# Patient Record
Sex: Male | Born: 1955
Health system: Southern US, Community
[De-identification: ages and names within clinical notes are randomized; demographics above are authoritative.]

## PROBLEM LIST (undated history)

## (undated) DIAGNOSIS — I10 Essential (primary) hypertension: Secondary | ICD-10-CM

## (undated) DIAGNOSIS — E785 Hyperlipidemia, unspecified: Secondary | ICD-10-CM

## (undated) DIAGNOSIS — K219 Gastro-esophageal reflux disease without esophagitis: Secondary | ICD-10-CM

## (undated) DIAGNOSIS — E039 Hypothyroidism, unspecified: Secondary | ICD-10-CM

## (undated) DIAGNOSIS — E119 Type 2 diabetes mellitus without complications: Secondary | ICD-10-CM

## (undated) DIAGNOSIS — M109 Gout, unspecified: Secondary | ICD-10-CM

## (undated) HISTORY — DX: Gout, unspecified: M10.9

## (undated) HISTORY — PX: TONSILLECTOMY: SUR1361

## (undated) HISTORY — DX: Type 2 diabetes mellitus without complications: E11.9

## (undated) HISTORY — DX: Essential (primary) hypertension: I10

## (undated) HISTORY — DX: Gastro-esophageal reflux disease without esophagitis: K21.9

## (undated) HISTORY — DX: Hypothyroidism, unspecified: E03.9

## (undated) HISTORY — DX: Hyperlipidemia, unspecified: E78.5

---

## 2014-03-22 DIAGNOSIS — E7800 Pure hypercholesterolemia, unspecified: Secondary | ICD-10-CM | POA: Insufficient documentation

## 2014-03-22 DIAGNOSIS — E119 Type 2 diabetes mellitus without complications: Secondary | ICD-10-CM | POA: Insufficient documentation

## 2014-03-22 DIAGNOSIS — E039 Hypothyroidism, unspecified: Secondary | ICD-10-CM | POA: Insufficient documentation

## 2014-03-22 DIAGNOSIS — N529 Male erectile dysfunction, unspecified: Secondary | ICD-10-CM | POA: Insufficient documentation

## 2014-03-22 DIAGNOSIS — I10 Essential (primary) hypertension: Secondary | ICD-10-CM | POA: Insufficient documentation

## 2014-08-16 DIAGNOSIS — M659 Synovitis and tenosynovitis, unspecified: Secondary | ICD-10-CM | POA: Insufficient documentation

## 2015-07-01 ENCOUNTER — Ambulatory Visit: Payer: Self-pay

## 2015-07-22 ENCOUNTER — Encounter: Payer: Self-pay | Admitting: Urology

## 2015-07-22 ENCOUNTER — Ambulatory Visit (INDEPENDENT_AMBULATORY_CARE_PROVIDER_SITE_OTHER): Payer: 59 | Admitting: Urology

## 2015-07-22 VITALS — BP 150/85 | HR 61 | Ht 70.5 in | Wt 245.7 lb

## 2015-07-22 DIAGNOSIS — N401 Enlarged prostate with lower urinary tract symptoms: Secondary | ICD-10-CM | POA: Diagnosis not present

## 2015-07-22 DIAGNOSIS — R972 Elevated prostate specific antigen [PSA]: Secondary | ICD-10-CM | POA: Diagnosis not present

## 2015-07-22 DIAGNOSIS — N138 Other obstructive and reflux uropathy: Secondary | ICD-10-CM

## 2015-07-22 LAB — BLADDER SCAN AMB NON-IMAGING: SCAN RESULT: 16

## 2015-07-22 NOTE — Progress Notes (Signed)
07/22/2015 4:11 PM   Benjamin Potter January 19, 1956 [id number]  Referring provider: No referring provider defined for this encounter.  Chief Complaint  Patient presents with  . Elevated PSA    referral from Benjamin Potter Benjamin Potter    HPI:  Benjamin Potter is a 59 year old white male who was found to have an elevated PSA at a wellness visit with his PCP, Benjamin Potter.  His PSA returned at 4.18ng/mL on 06/04/2015.    His IPSS score today is 3, which is mild lower urinary tract symptomatology. He is mostly satisfied with his quality life due to his urinary symptoms. His PVR is 16 mL.  His    He denies any dysuria, hematuria or suprapubic pain.     He also denies any recent fevers, chills, nausea or vomiting.  He does not have a family history of PCa.      IPSS      07/22/15 1600       International Prostate Symptom Score   How often have you had the sensation of not emptying your bladder? Not at All     How often have you had to urinate less than every two hours? Not at All     How often have you found you stopped and started again several times when you urinated? Less than 1 in 5 times     How often have you found it difficult to postpone urination? Not at All     How often have you had a weak urinary stream? Not at All     How often have you had to strain to start urination? Not at All     How many times did you typically get up at night to urinate? 2 Times     Total IPSS Score 3     Quality of Life due to urinary symptoms   If you were to spend the rest of your life with your urinary condition just the way it is now how would you feel about that? Mostly Satisfied        Score:  1-7 Mild 8-19 Moderate 20-35 Severe     PMH: Past Medical History  Diagnosis Date  . Diabetes mellitus, type II   . HTN (hypertension)   . HLD (hyperlipidemia)   . Hypothyroidism   . Acid reflux   . Gout     Surgical History: Past Surgical History  Procedure Laterality Date  .  Tonsillectomy      Home Medications:    Medication List       This list is accurate as of: 07/22/15  4:11 PM.  Always use your most recent med list.               amLODipine 10 MG tablet  Commonly known as:  NORVASC  TAKE ONE TABLET BY MOUTH ONCE DAILY     atenolol 50 MG tablet  Commonly known as:  TENORMIN  TAKE ONE TABLET BY MOUTH ONCE DAILY     benazepril 20 MG tablet  Commonly known as:  LOTENSIN  Take by mouth.     colchicine 0.6 MG tablet  Take 2 tablets (1.2mg ) by mouth at first sign of gout flare followed by 1 tablet (0.6mg ) after 1 hour. (Max 1.8mg  within 1 hour)     levothyroxine 125 MCG tablet  Commonly known as:  SYNTHROID, LEVOTHROID  TAKE 1 TABLET BY MOUTH ONCE DAILY ON AN EMPTY STOMACH WITH A GLASS OF WATER AT LEAST 30-60  MINUTES BEFORE BREAKFAST     metFORMIN 1000 MG tablet  Commonly known as:  GLUCOPHAGE  TAKE ONE TABLET BY MOUTH TWICE DAILY WITH MEALS     ONE TOUCH ULTRA TEST test strip  Generic drug:  glucose blood  Use 1 strip via meter twice daily as directed.     simvastatin 20 MG tablet  Commonly known as:  ZOCOR  Take by mouth.        Allergies: No Known Allergies  Family History: Family History  Problem Relation Age of Onset  . Kidney disease Neg Hx   . Bladder Cancer Neg Hx   . Prostate cancer Neg Hx   . Diabetes Mellitus II Mother   . Stroke Father     Social History:  reports that he has quit smoking. He does not have any smokeless tobacco history on file. He reports that he drinks alcohol. He reports that he does not use illicit drugs.  ROS: UROLOGY Frequent Urination?: No Hard to postpone urination?: No Burning/pain with urination?: No Get up at night to urinate?: Yes Leakage of urine?: No Urine stream starts and stops?: Yes Trouble starting stream?: No Do you have to strain to urinate?: No Blood in urine?: No Urinary tract infection?: No Sexually transmitted disease?: No Injury to kidneys or bladder?: No Painful  intercourse?: No Weak stream?: No Erection problems?: Yes Penile pain?: No  Gastrointestinal Nausea?: No Vomiting?: No Indigestion/heartburn?: Yes Diarrhea?: No Constipation?: No  Constitutional Fever: No Night sweats?: No Weight loss?: No Fatigue?: No  Skin Skin rash/lesions?: No Itching?: No  Eyes Blurred vision?: No Double vision?: No  Ears/Nose/Throat Sore throat?: No Sinus problems?: No  Hematologic/Lymphatic Swollen glands?: No Easy bruising?: No  Cardiovascular Leg swelling?: No Chest pain?: No  Respiratory Cough?: No Shortness of breath?: No  Endocrine Excessive thirst?: No  Musculoskeletal Back pain?: No Joint pain?: No  Neurological Headaches?: No Dizziness?: No  Psychologic Depression?: No Anxiety?: No  Physical Exam: BP 150/85 mmHg  Pulse 61  Ht 5' 10.5" (1.791 m)  Wt 245 lb 11.2 oz (111.449 kg)  BMI 34.74 kg/m2  Constitutional:  Alert and oriented, No acute distress. HEENT: [place] AT, moist mucus membranes.  Trachea midline, no masses. Cardiovascular: No clubbing, cyanosis, or edema. Respiratory: Normal respiratory effort, no increased work of breathing. GI: Abdomen is soft, nontender, nondistended, no abdominal masses GU: No CVA tenderness. GU: Patient with circumcised phallus.  Urethral meatus is patent.  No penile discharge. No penile lesions or rashes. Scrotum without lesions, cysts, rashes and/or edema.  Testicles are located scrotally bilaterally. No masses are appreciated in the testicles. Left and right epididymis are normal. Rectal: Patient with  normal sphincter tone. Perineum without scarring or rashes. No rectal masses are appreciated. Prostate is approximately 55 grams, no nodules are appreciated. Seminal vesicles are normal. Skin: No rashes, bruises or suspicious lesions. Lymph: No cervical or inguinal adenopathy. Neurologic: Grossly intact, no focal deficits, moving all 4 extremities. Psychiatric: Normal mood and  affect.  Laboratory Data: Results for orders placed or performed in visit on 07/22/15  PSA  Result Value Ref Range   Prostate Specific Ag, Serum 3.8 0.0 - 4.0 ng/mL  BLADDER SCAN AMB NON-IMAGING  Result Value Ref Range   Scan Result 16    No results found for: WBC, HGB, HCT, MCV, PLT  No results found for: CREATININE  No results found for: PSA  No results found for: TESTOSTERONE  No results found for: HGBA1C  Urinalysis No results found  for: COLORURINE, APPEARANCEUR, LABSPEC, PHURINE, GLUCOSEU, HGBUR, BILIRUBINUR, KETONESUR, PROTEINUR, UROBILINOGEN, NITRITE, LEUKOCYTESUR  Pertinent Imaging:   Assessment & Plan:    1. Elevated PSA:   Patient was found to have a PSA of 4.18 on a routine physical.  His repeated PSA with [place] today is 3.8 ng/mL.  His DRE was benign.  We will continue to monitor closely and have him return in 4 months for a repeat PSA and DRE.    - PSA - BLADDER SCAN AMB NON-IMAGING   2. BPH with LUTS:  Patient's IPSS score is 3/2.  His PVR 16 mL.  His DRE demonstrates enlargement.  He will follow up in 4 months for a PSA, DRE, PVR and an IPSS.        No Follow-up on file.  Michiel Cowboy, PA-C  [facility] [address] [place], [place] [address] [phone]

## 2015-07-23 ENCOUNTER — Telehealth: Payer: Self-pay

## 2015-07-23 DIAGNOSIS — R972 Elevated prostate specific antigen [PSA]: Secondary | ICD-10-CM

## 2015-07-23 LAB — PSA: PROSTATE SPECIFIC AG, SERUM: 3.8 ng/mL (ref 0.0–4.0)

## 2015-07-23 NOTE — Telephone Encounter (Signed)
LMOM

## 2015-07-23 NOTE — Telephone Encounter (Signed)
Pt returned call and PSA results were given. Pt asked if an appt needed to be made in 57mo or just a lab draw. Please advise.

## 2015-07-23 NOTE — Telephone Encounter (Signed)
-----   Message from Shannon A McGowan, PA-C sent at 07/23/2015  8:36 AM EDT ----- PSA has decreased.  I would like to see the patient in 4 months for another PSA to ensure the downward trend continues. 

## 2015-07-23 NOTE — Telephone Encounter (Signed)
-----   Message from Harle Battiest, PA-C sent at 07/23/2015  8:36 AM EDT ----- PSA has decreased.  I would like to see the patient in 4 months for another PSA to ensure the downward trend continues.

## 2015-07-24 DIAGNOSIS — N138 Other obstructive and reflux uropathy: Secondary | ICD-10-CM | POA: Insufficient documentation

## 2015-07-24 DIAGNOSIS — N401 Enlarged prostate with lower urinary tract symptoms: Secondary | ICD-10-CM

## 2015-07-24 DIAGNOSIS — R972 Elevated prostate specific antigen [PSA]: Secondary | ICD-10-CM | POA: Insufficient documentation

## 2015-07-24 NOTE — Telephone Encounter (Signed)
LMOM

## 2015-07-24 NOTE — Telephone Encounter (Signed)
Spoke with pt and made aware of needing an appt in 18mo. Pt voiced understanding and was transferred to the front. Orders have been placed.

## 2015-07-24 NOTE — Telephone Encounter (Signed)
Appointment as well.  He has LUTS that need to be monitored as well as a DRE.

## 2015-11-25 ENCOUNTER — Other Ambulatory Visit: Payer: 59

## 2015-11-25 DIAGNOSIS — R972 Elevated prostate specific antigen [PSA]: Secondary | ICD-10-CM

## 2015-11-26 LAB — PSA: Prostate Specific Ag, Serum: 3.8 ng/mL (ref 0.0–4.0)

## 2015-12-02 ENCOUNTER — Encounter: Payer: Self-pay | Admitting: Urology

## 2015-12-02 ENCOUNTER — Ambulatory Visit (INDEPENDENT_AMBULATORY_CARE_PROVIDER_SITE_OTHER): Payer: 59 | Admitting: Urology

## 2015-12-02 VITALS — BP 162/84 | HR 57 | Ht 70.5 in | Wt 256.8 lb

## 2015-12-02 DIAGNOSIS — R972 Elevated prostate specific antigen [PSA]: Secondary | ICD-10-CM

## 2015-12-02 DIAGNOSIS — N138 Other obstructive and reflux uropathy: Secondary | ICD-10-CM

## 2015-12-02 DIAGNOSIS — N401 Enlarged prostate with lower urinary tract symptoms: Secondary | ICD-10-CM

## 2015-12-02 LAB — BLADDER SCAN AMB NON-IMAGING: SCAN RESULT: 0

## 2015-12-02 NOTE — Progress Notes (Signed)
8:54 AM   Benjamin Potter 07-17-1956 [id number]  Referring provider: Sula Rumple, MD [address] MEDICAL [address] [place], [place] [address]  Chief Complaint  Patient presents with  . Benign Prostatic Hypertrophy    4 month follow up  . Elevated PSA    HPI: Patient is a 59 year old white male who presents today for a 4 month follow up for an elevated PSA and BPH with LUTS.  Patient was found to have an elevated PSA at a wellness visit with his PCP, Dr. Elmer Potter.  His PSA returned at 4.18ng/mL on 06/04/2015.  A repeated PSA performed on 07/2015 was 3.8 ng/mL.  His most recent PSA from last week remains at 3.8 ng/mL.  His IPSS score today is 2, which is mild lower urinary tract symptomatology. He is pleased with his quality life due to his urinary symptoms. His PVR is 0 mL.  He denies any dysuria, hematuria or suprapubic pain. He also denies any recent fevers, chills, nausea or vomiting.  He does not have a family history of PCa.      IPSS      12/02/15 0800       International Prostate Symptom Score   How often have you had the sensation of not emptying your bladder? Not at All     How often have you had to urinate less than every two hours? Less than 1 in 5 times     How often have you found you stopped and started again several times when you urinated? Not at All     How often have you found it difficult to postpone urination? Not at All     How often have you had a weak urinary stream? Not at All     How often have you had to strain to start urination? Not at All     How many times did you typically get up at night to urinate? 1 Time     Total IPSS Score 2     Quality of Life due to urinary symptoms   If you were to spend the rest of your life with your urinary condition just the way it is now how would you feel about that? Pleased        Score:  1-7 Mild 8-19 Moderate 20-35 Severe     PMH: Past Medical History  Diagnosis Date  . Diabetes mellitus, type II (HCC)   . HTN  (hypertension)   . HLD (hyperlipidemia)   . Hypothyroidism   . Acid reflux   . Gout     Surgical History: Past Surgical History  Procedure Laterality Date  . Tonsillectomy      Home Medications:    Medication List       This list is accurate as of: 12/02/15  8:54 AM.  Always use your most recent med list.               amLODipine 10 MG tablet  Commonly known as:  NORVASC  TAKE ONE TABLET BY MOUTH ONCE DAILY     atenolol 50 MG tablet  Commonly known as:  TENORMIN  TAKE ONE TABLET BY MOUTH ONCE DAILY     benazepril 20 MG tablet  Commonly known as:  LOTENSIN  Take by mouth.     colchicine 0.6 MG tablet  Take 2 tablets (1.2mg ) by mouth at first sign of gout flare followed by 1 tablet (0.6mg ) after 1 hour. (Max 1.8mg  within 1 hour)  levothyroxine 125 MCG tablet  Commonly known as:  SYNTHROID, LEVOTHROID  TAKE 1 TABLET BY MOUTH ONCE DAILY ON AN EMPTY STOMACH WITH A GLASS OF WATER AT LEAST 30-60 MINUTES BEFORE BREAKFAST     metFORMIN 1000 MG tablet  Commonly known as:  GLUCOPHAGE  TAKE ONE TABLET BY MOUTH TWICE DAILY WITH MEALS     ONE TOUCH ULTRA TEST test strip  Generic drug:  glucose blood  Use 1 strip via meter twice daily as directed.     predniSONE 20 MG tablet  Commonly known as:  DELTASONE  Take 3 tablets by mouth on day 1, then take 2 tablets by mouth on days 2-4, then take 1 tablet by mouth on days 5-7     simvastatin 20 MG tablet  Commonly known as:  ZOCOR  Take by mouth.        Allergies: No Known Allergies  Family History: Family History  Problem Relation Age of Onset  . Kidney disease Neg Hx   . Bladder Cancer Neg Hx   . Prostate cancer Neg Hx   . Diabetes Mellitus II Mother   . Stroke Father     Social History:  reports that he has quit smoking. He does not have any smokeless tobacco history on file. He reports that he drinks alcohol. He reports that he does not use illicit drugs.  ROS: UROLOGY Frequent Urination?: No Hard to  postpone urination?: No Burning/pain with urination?: No Get up at night to urinate?: Yes Leakage of urine?: No Urine stream starts and stops?: No Trouble starting stream?: No Do you have to strain to urinate?: No Blood in urine?: No Urinary tract infection?: No Sexually transmitted disease?: No Injury to kidneys or bladder?: No Painful intercourse?: No Weak stream?: No Erection problems?: No Penile pain?: No  Gastrointestinal Nausea?: No Vomiting?: No Indigestion/heartburn?: No Diarrhea?: No Constipation?: No  Constitutional Fever: No Night sweats?: No Weight loss?: No Fatigue?: No  Skin Skin rash/lesions?: No Itching?: No  Eyes Blurred vision?: No Double vision?: No  Ears/Nose/Throat Sore throat?: No Sinus problems?: No  Hematologic/Lymphatic Swollen glands?: No Easy bruising?: No  Cardiovascular Leg swelling?: No Chest pain?: No  Respiratory Cough?: No Shortness of breath?: No  Endocrine Excessive thirst?: No  Musculoskeletal Back pain?: No Joint pain?: No  Neurological Headaches?: No Dizziness?: No  Psychologic Depression?: No Anxiety?: No  Physical Exam: BP 162/84 mmHg  Pulse 57  Ht 5' 10.5" (1.791 m)  Wt 256 lb 12.8 oz (116.484 kg)  BMI 36.31 kg/m2  GU: No CVA tenderness. GU: Patient with circumcised phallus.  Urethral meatus is patent.  No penile discharge. No penile lesions or rashes. Scrotum without lesions, cysts, rashes and/or edema.  Testicles are located scrotally bilaterally. No masses are appreciated in the testicles. Left and right epididymis are normal. Rectal: Patient with  normal sphincter tone. Perineum without scarring or rashes. No rectal masses are appreciated. Prostate is approximately 55 grams, no nodules are appreciated. Seminal vesicles are normal.  Laboratory Data:  Lab Results  Component Value Date   PSA 3.8 11/25/2015   PSA 3.8 07/22/2015   Pertinent Imaging: Results for Benjamin Potter (MRN [id number])  as of 12/02/2015 08:52  Ref. Range 12/02/2015 08:40  Scan Result Unknown 0    Assessment & Plan:    1. Elevated PSA:   Patient was found to have a PSA of 4.18 in June 2016 on a routine physical.  His repeated PSA was is 3.8 ng/mL.  It is 3.8  ng/mL again today.  His DRE remains benign.   We will continue to monitor closely and have him return in 6 months for a repeat PSA and exam.     2. BPH with LUTS:  Patient's IPSS score is 2/1.  His PVR 0 mL.  His DRE demonstrates benign enlargement.  His PSA remains stable.  He will follow up in 6 months for a PSA, exam and an IPSS score.    - BLADDER SCAN AMB NON-IMAGING  Return in about 6 months (around 06/01/2016) for IPSS and exam.  Michiel CowboySHANNON Senya Hinzman, [facility]  [facility] [address], [address] [place], [place] [address] [phone]

## 2016-04-02 DIAGNOSIS — J301 Allergic rhinitis due to pollen: Secondary | ICD-10-CM | POA: Insufficient documentation

## 2016-05-25 ENCOUNTER — Other Ambulatory Visit: Payer: 59

## 2016-05-25 DIAGNOSIS — R972 Elevated prostate specific antigen [PSA]: Secondary | ICD-10-CM

## 2016-05-26 LAB — PSA: PROSTATE SPECIFIC AG, SERUM: 4.2 ng/mL — AB (ref 0.0–4.0)

## 2016-06-01 ENCOUNTER — Encounter: Payer: Self-pay | Admitting: Urology

## 2016-06-01 ENCOUNTER — Ambulatory Visit (INDEPENDENT_AMBULATORY_CARE_PROVIDER_SITE_OTHER): Payer: 59 | Admitting: Urology

## 2016-06-01 VITALS — BP 162/102 | HR 60 | Ht 70.5 in | Wt 260.0 lb

## 2016-06-01 DIAGNOSIS — R972 Elevated prostate specific antigen [PSA]: Secondary | ICD-10-CM

## 2016-06-01 DIAGNOSIS — N401 Enlarged prostate with lower urinary tract symptoms: Secondary | ICD-10-CM | POA: Diagnosis not present

## 2016-06-01 DIAGNOSIS — N138 Other obstructive and reflux uropathy: Secondary | ICD-10-CM

## 2016-06-01 NOTE — Progress Notes (Signed)
8:43 AM   Benjamin Potter 1956/09/06 [id number]  Referring provider: Sula Rumple, MD [address] MEDICAL [address] [place], [place] [address]  Chief Complaint  Patient presents with  . Benign Prostatic Hypertrophy    56month    HPI: Patient is a 60 year old Caucasian male who presents today for a 6 month follow up for elevated PSA and BPH with LUTS.  Elevated PSA Patient was found to have an elevated PSA at a wellness visit with his PCP, Dr. Elmer Potter.  His PSA returned at 4.18ng/mL on 06/04/2015.  A repeated PSA performed on 07/2015 was 3.8 ng/mL.  His PSA from 05/25/2016 was 4.2 ng/mL.    BPH with LUTS His IPSS score today is 1, which is mild lower urinary tract symptomatology. He is pleased with his quality life due to his urinary symptoms.  He denies any dysuria, hematuria or suprapubic pain. He also denies any recent fevers, chills, nausea or vomiting.  He does not have a family history of PCa.      IPSS      06/01/16 0800       International Prostate Symptom Score   How often have you had the sensation of not emptying your bladder? Not at All     How often have you had to urinate less than every two hours? Not at All     How often have you found you stopped and started again several times when you urinated? Not at All     How often have you found it difficult to postpone urination? Not at All     How often have you had a weak urinary stream? Not at All     How often have you had to strain to start urination? Not at All     How many times did you typically get up at night to urinate? 1 Time     Total IPSS Score 1     Quality of Life due to urinary symptoms   If you were to spend the rest of your life with your urinary condition just the way it is now how would you feel about that? Pleased        Score:  1-7 Mild 8-19 Moderate 20-35 Severe     PMH: Past Medical History  Diagnosis Date  . Diabetes mellitus, type II (HCC)   . HTN (hypertension)   . HLD (hyperlipidemia)   .  Hypothyroidism   . Acid reflux   . Gout     Surgical History: Past Surgical History  Procedure Laterality Date  . Tonsillectomy      Home Medications:    Medication List       This list is accurate as of: 06/01/16  8:43 AM.  Always use your most recent med list.               amLODipine 10 MG tablet  Commonly known as:  NORVASC  TAKE ONE TABLET BY MOUTH ONCE DAILY     atenolol 50 MG tablet  Commonly known as:  TENORMIN  TAKE ONE TABLET BY MOUTH ONCE DAILY     benazepril 20 MG tablet  Commonly known as:  LOTENSIN  Take by mouth.     colchicine 0.6 MG tablet  Take 2 tablets (1.2mg ) by mouth at first sign of gout flare followed by 1 tablet (0.6mg ) after 1 hour. (Max 1.8mg  within 1 hour)     levothyroxine 125 MCG tablet  Commonly known as:  SYNTHROID, LEVOTHROID  TAKE 1 TABLET BY MOUTH ONCE DAILY ON AN EMPTY STOMACH WITH A GLASS OF WATER AT LEAST 30-60 MINUTES BEFORE BREAKFAST     loratadine 10 MG tablet  Commonly known as:  CLARITIN  Take 10 mg by mouth daily.     metFORMIN 1000 MG tablet  Commonly known as:  GLUCOPHAGE  TAKE ONE TABLET BY MOUTH TWICE DAILY WITH MEALS     ONE TOUCH ULTRA TEST test strip  Generic drug:  glucose blood  Use 1 strip via meter twice daily as directed.     predniSONE 20 MG tablet  Commonly known as:  DELTASONE  Take 3 tablets by mouth on day 1, then take 2 tablets by mouth on days 2-4, then take 1 tablet by mouth on days 5-7     simvastatin 20 MG tablet  Commonly known as:  ZOCOR  Take by mouth.        Allergies: No Known Allergies  Family History: Family History  Problem Relation Age of Onset  . Kidney disease Neg Hx   . Bladder Cancer Neg Hx   . Prostate cancer Neg Hx   . Diabetes Mellitus II Mother   . Stroke Father     Social History:  reports that he has quit smoking. He does not have any smokeless tobacco history on file. He reports that he drinks alcohol. He reports that he does not use illicit  drugs.  ROS: UROLOGY Frequent Urination?: No Hard to postpone urination?: No Burning/pain with urination?: No Get up at night to urinate?: No Leakage of urine?: No Urine stream starts and stops?: No Trouble starting stream?: No Do you have to strain to urinate?: No Blood in urine?: No Urinary tract infection?: No Sexually transmitted disease?: No Injury to kidneys or bladder?: No Painful intercourse?: No Weak stream?: No Erection problems?: No Penile pain?: No  Gastrointestinal Nausea?: No Vomiting?: No Indigestion/heartburn?: No Diarrhea?: No Constipation?: No  Constitutional Fever: No Night sweats?: No Weight loss?: No Fatigue?: No  Skin Skin rash/lesions?: No Itching?: No  Eyes Blurred vision?: No Double vision?: No  Ears/Nose/Throat Sore throat?: No Sinus problems?: No  Hematologic/Lymphatic Swollen glands?: No Easy bruising?: No  Cardiovascular Leg swelling?: No Chest pain?: No  Respiratory Cough?: No Shortness of breath?: No  Endocrine Excessive thirst?: No  Musculoskeletal Back pain?: No Joint pain?: No  Neurological Headaches?: No Dizziness?: No  Psychologic Depression?: No Anxiety?: No  Physical Exam: BP 162/102 mmHg  Pulse 60  Ht 5' 10.5" (1.791 m)  Wt 260 lb (117.935 kg)  BMI 36.77 kg/m2  GU: No CVA tenderness. GU: Patient with circumcised phallus.  Urethral meatus is patent.  No penile discharge. No penile lesions or rashes. Scrotum without lesions, cysts, rashes and/or edema.  Testicles are located scrotally bilaterally. No masses are appreciated in the testicles. Left and right epididymis are normal. Rectal: Patient with  normal sphincter tone. Perineum without scarring or rashes. No rectal masses are appreciated. Prostate is approximately 55 grams, no nodules are appreciated. Seminal vesicles are normal.  Laboratory Data: PSA History  4.18 ng/mL on 05/2015  3.8 ng/mL on 05/2015  4.2 ng/mL on 05/25/2016  Pertinent  Imaging: Results for Benjamin Potter, Benjamin Potter (MRN [id number]) as of 12/02/2015 08:52  Ref. Range 12/02/2015 08:40  Scan Result Unknown 0    Assessment & Plan:    1. Elevated PSA:   Patient was found to have a PSA of 4.18 in June 2016 on a routine physical.  His repeated PSA was is  3.8 ng/mL.  It is 4.2 ng/mL today.  His DRE remains benign.   We discussed repeating the PSA today, returning 4 months for repeat examination and PSA or initiating finasteride 5 mg daily and returning in 3 months for a PSA.  He would like us to repeat the PSA.  If the PSA returns lower, we will continue 6 month follow-ups. If it returns at the same value or somewhat elevated, we'll initiate finasteride 5 mg daily and he will return in 3 months for repeat PSA.   2. BPH with LUTS:  Patient's IPSS score is 1/1.  His PVR 0 mL.   He will follow up pending PSA results.      Return for pending PSA results.  Michiel CowboySHANNON Lodema Parma, PA-C  [facility] [address], [address] [place], [place] [address] [phone]

## 2016-06-02 ENCOUNTER — Telehealth: Payer: Self-pay

## 2016-06-02 DIAGNOSIS — R972 Elevated prostate specific antigen [PSA]: Secondary | ICD-10-CM

## 2016-06-02 LAB — PSA: Prostate Specific Ag, Serum: 4.9 ng/mL — ABNORMAL HIGH (ref 0.0–4.0)

## 2016-06-02 NOTE — Telephone Encounter (Signed)
LMOM

## 2016-06-02 NOTE — Telephone Encounter (Signed)
-----   Message from Harle BattiestShannon A McGowan, PA-C sent at 06/02/2016  8:08 AM EDT ----- PSA is still increasing.  I suggest starting finasteride 5 mg daily and repeated the PSA in three months.

## 2016-06-03 MED ORDER — FINASTERIDE 5 MG PO TABS: 5.0000 mg | ORAL_TABLET | Freq: Every day | ORAL | Status: DC

## 2016-06-03 NOTE — Telephone Encounter (Signed)
Spoke with pt in reference to PSA and finasteride. Pt voiced understanding. Orders placed and lab appt made.

## 2016-06-03 NOTE — Telephone Encounter (Signed)
LMOM

## 2016-06-09 ENCOUNTER — Telehealth: Payer: Self-pay | Admitting: Urology

## 2016-06-09 DIAGNOSIS — R972 Elevated prostate specific antigen [PSA]: Secondary | ICD-10-CM

## 2016-06-09 MED ORDER — FINASTERIDE 5 MG PO TABS: 5.0000 mg | ORAL_TABLET | Freq: Every day | ORAL | Status: DC

## 2016-06-09 NOTE — Telephone Encounter (Signed)
I've resent the prescription to the pharmacy.  Please apologize to the patient, but we always seem to have trouble with escribing to [facility].

## 2016-06-09 NOTE — Telephone Encounter (Signed)
Patient is still waiting on his medication. He said it was never called in to the pharmacy for finasteride. Can we check on this and call him.  Thanks,  michelle

## 2016-06-11 NOTE — Telephone Encounter (Signed)
Called and LM that this had been done   WinthropMichelle

## 2016-09-03 ENCOUNTER — Other Ambulatory Visit: Payer: 59

## 2016-09-03 DIAGNOSIS — R972 Elevated prostate specific antigen [PSA]: Secondary | ICD-10-CM

## 2016-09-04 ENCOUNTER — Telehealth: Payer: Self-pay | Admitting: Family Medicine

## 2016-09-04 LAB — PSA: Prostate Specific Ag, Serum: 2 ng/mL (ref 0.0–4.0)

## 2016-09-04 NOTE — Telephone Encounter (Signed)
-----   Message from Harle BattiestShannon A McGowan, PA-C sent at 09/04/2016  8:20 AM EDT ----- PSA has declined and I would like to see him again in 3 months.  PSA prior to the appointment.

## 2016-09-04 NOTE — Telephone Encounter (Signed)
Left message on machine for patient to return call

## 2016-09-08 NOTE — Telephone Encounter (Signed)
LMOM

## 2016-09-10 NOTE — Telephone Encounter (Signed)
LMOM- will send a letter.  

## 2016-12-01 ENCOUNTER — Other Ambulatory Visit: Payer: Self-pay

## 2016-12-01 DIAGNOSIS — R972 Elevated prostate specific antigen [PSA]: Secondary | ICD-10-CM

## 2016-12-03 ENCOUNTER — Other Ambulatory Visit: Payer: Self-pay | Admitting: Urology

## 2016-12-03 ENCOUNTER — Other Ambulatory Visit: Payer: 59

## 2016-12-03 DIAGNOSIS — R972 Elevated prostate specific antigen [PSA]: Secondary | ICD-10-CM

## 2016-12-04 LAB — PSA: PROSTATE SPECIFIC AG, SERUM: 1.6 ng/mL (ref 0.0–4.0)

## 2016-12-09 ENCOUNTER — Encounter: Payer: Self-pay | Admitting: Urology

## 2016-12-09 ENCOUNTER — Ambulatory Visit (INDEPENDENT_AMBULATORY_CARE_PROVIDER_SITE_OTHER): Payer: 59 | Admitting: Urology

## 2016-12-09 VITALS — BP 158/83 | HR 58 | Ht 70.5 in | Wt 257.8 lb

## 2016-12-09 DIAGNOSIS — N138 Other obstructive and reflux uropathy: Secondary | ICD-10-CM

## 2016-12-09 DIAGNOSIS — Z87898 Personal history of other specified conditions: Secondary | ICD-10-CM

## 2016-12-09 DIAGNOSIS — N401 Enlarged prostate with lower urinary tract symptoms: Secondary | ICD-10-CM | POA: Diagnosis not present

## 2016-12-09 MED ORDER — FINASTERIDE 5 MG PO TABS: 5.0000 mg | ORAL_TABLET | Freq: Every day | ORAL | 3 refills | Status: DC

## 2016-12-09 NOTE — Progress Notes (Signed)
8:28 AM   Mardene SayerEddie Verstraete [birth year] [id number]  Referring provider: Sula Rumpleharanjit Virk, MD [address] [place], [place] [address]  Chief Complaint  Patient presents with  . Benign Prostatic Hypertrophy    3 month follow up  . Elevated PSA    HPI: Patient is a 60 year old Caucasian male who presents today for a 6 month follow up for elevated PSA and BPH with LUTS.  History of elevated PSA Patient was found to have an elevated PSA at a wellness visit with his PCP, Dr. Elmer RampVirk.  His PSA returned at 4.18ng/mL on 06/04/2015.  A repeated PSA performed on 07/2015 was 3.8 ng/mL.  His PSA is now 1.6 ng/mL on 12/03/2016.    BPH with LUTS His IPSS score today is 0, which is no lower urinary tract symptomatology. He is delighted with his quality life due to his urinary symptoms.  His previous I PSS score was 1/1.  He has no complaints at this visit.  He is currently on finasteride 5 mg daily.  He denies any dysuria, hematuria or suprapubic pain. He also denies any recent fevers, chills, nausea or vomiting.  He does not have a family history of PCa.      IPSS    Row Name 12/09/16 0800         International Prostate Symptom Score   How often have you had the sensation of not emptying your bladder? Not at All     How often have you had to urinate less than every two hours? Not at All     How often have you found you stopped and started again several times when you urinated? Not at All     How often have you found it difficult to postpone urination? Not at All     How often have you had a weak urinary stream? Not at All     How often have you had to strain to start urination? Not at All     How many times did you typically get up at night to urinate? None     Total IPSS Score 0       Quality of Life due to urinary symptoms   If you were to spend the rest of your life with your urinary condition just the way it is now how would you feel about that? Delighted        Score:  1-7  Mild 8-19 Moderate 20-35 Severe     PMH: Past Medical History:  Diagnosis Date  . Acid reflux   . Diabetes mellitus, type II (HCC)   . Gout   . HLD (hyperlipidemia)   . HTN (hypertension)   . Hypothyroidism     Surgical History: Past Surgical History:  Procedure Laterality Date  . TONSILLECTOMY      Home Medications:  Allergies as of 12/09/2016   No Known Allergies     Medication List       Accurate as of 12/09/16  8:28 AM. Always use your most recent med list.          amLODipine 10 MG tablet Commonly known as:  NORVASC TAKE ONE TABLET BY MOUTH ONCE DAILY   atenolol 50 MG tablet Commonly known as:  TENORMIN TAKE ONE TABLET BY MOUTH ONCE DAILY   benazepril 20 MG tablet Commonly known as:  LOTENSIN Take by mouth.   colchicine 0.6 MG tablet Take 2 tablets (1.2mg ) by mouth at first sign of gout flare followed  by 1 tablet (0.6mg ) after 1 hour. (Max 1.8mg  within 1 hour)   finasteride 5 MG tablet Commonly known as:  PROSCAR Take 1 tablet (5 mg total) by mouth daily.   fluticasone 50 MCG/ACT nasal spray Commonly known as:  FLONASE Place into the nose.   levothyroxine 125 MCG tablet Commonly known as:  SYNTHROID, LEVOTHROID TAKE 1 TABLET BY MOUTH ONCE DAILY ON AN EMPTY STOMACH WITH A GLASS OF WATER AT LEAST 30-60 MINUTES BEFORE BREAKFAST   loratadine 10 MG tablet Commonly known as:  CLARITIN Take 10 mg by mouth daily.   metFORMIN 1000 MG tablet Commonly known as:  GLUCOPHAGE TAKE ONE TABLET BY MOUTH TWICE DAILY WITH MEALS   ONE TOUCH ULTRA TEST test strip Generic drug:  glucose blood Use 1 strip via meter twice daily as directed.   predniSONE 20 MG tablet Commonly known as:  DELTASONE Take 3 tablets by mouth on day 1, then take 2 tablets by mouth on days 2-4, then take 1 tablet by mouth on days 5-7   sildenafil 100 MG tablet Commonly known as:  VIAGRA Take by mouth.   simvastatin 20 MG tablet Commonly known as:  ZOCOR Take by mouth.        Allergies: No Known Allergies  Family History: Family History  Problem Relation Age of Onset  . Diabetes Mellitus II Mother   . Stroke Father   . Kidney disease Neg Hx   . Bladder Cancer Neg Hx   . Prostate cancer Neg Hx     Social History:  reports that he has quit smoking. He has never used smokeless tobacco. He reports that he drinks alcohol. He reports that he does not use drugs.  ROS: UROLOGY Frequent Urination?: No Hard to postpone urination?: No Burning/pain with urination?: No Get up at night to urinate?: No Leakage of urine?: No Urine stream starts and stops?: No Trouble starting stream?: No Do you have to strain to urinate?: No Blood in urine?: No Urinary tract infection?: No Sexually transmitted disease?: No Injury to kidneys or bladder?: No Painful intercourse?: No Weak stream?: No Erection problems?: No Penile pain?: No  Gastrointestinal Nausea?: No Vomiting?: No Indigestion/heartburn?: No Diarrhea?: No Constipation?: No  Constitutional Fever: No Night sweats?: No Weight loss?: No Fatigue?: No  Skin Skin rash/lesions?: No Itching?: No  Eyes Blurred vision?: No Double vision?: No  Ears/Nose/Throat Sore throat?: No Sinus problems?: No  Hematologic/Lymphatic Swollen glands?: No Easy bruising?: No  Cardiovascular Leg swelling?: No Chest pain?: No  Respiratory Cough?: No Shortness of breath?: No  Endocrine Excessive thirst?: No  Musculoskeletal Back pain?: No Joint pain?: No  Neurological Headaches?: No Dizziness?: No  Psychologic Depression?: No Anxiety?: No  Physical Exam: BP (!) 158/83   Pulse (!) 58   Ht 5' 10.5" (1.791 m)   Wt 257 lb 12.8 oz (116.9 kg)   BMI 36.47 kg/m   GU: No CVA tenderness. GU: Patient with circumcised phallus.  Urethral meatus is patent.  No penile discharge. No penile lesions or rashes. Scrotum without lesions, cysts, rashes and/or edema.  Testicles are located scrotally bilaterally.  No masses are appreciated in the testicles. Left and right epididymis are normal. Rectal: Patient with  normal sphincter tone. Perineum without scarring or rashes. No rectal masses are appreciated. Prostate is approximately 55 grams, no nodules are appreciated. Seminal vesicles are normal.  Laboratory Data: PSA History  4.18 ng/mL on 05/2015  3.8 ng/mL on 05/2015  4.2 ng/mL on 05/25/2016 - started on  finasteride  1.6 ng/mL on 12/03/2016   Assessment & Plan:    1. History of elevated PSA  - PSA reached 4.18 in 05/2015  - continue finasteride 5 mg daily  - RTC in 6 months for PSA and exam  2. BPH with LUTS  - IPSS score is 0/0, it is improving  - Continue conservative management, avoiding bladder irritants and timed voiding's  - Continue finasteride 5 mg daily; refills given  - Cannot tolerate medication or medication failure, schedule cystoscopy  - RTC in 6 months for IPSS, PSA and exam    Return in about 6 months (around 06/09/2017) for IPSS, PSA and exam.  Michiel CowboySHANNON Layna Roeper, [facility]  [facility] [address], [address] [place], [place] [phone]

## 2017-01-19 DIAGNOSIS — I10 Essential (primary) hypertension: Secondary | ICD-10-CM | POA: Diagnosis not present

## 2017-01-19 DIAGNOSIS — E119 Type 2 diabetes mellitus without complications: Secondary | ICD-10-CM | POA: Diagnosis not present

## 2017-01-19 DIAGNOSIS — E7800 Pure hypercholesterolemia, unspecified: Secondary | ICD-10-CM | POA: Diagnosis not present

## 2017-05-20 ENCOUNTER — Other Ambulatory Visit: Payer: Self-pay

## 2017-05-20 DIAGNOSIS — Z87898 Personal history of other specified conditions: Secondary | ICD-10-CM

## 2017-06-07 ENCOUNTER — Other Ambulatory Visit: Payer: 59

## 2017-06-07 DIAGNOSIS — Z87898 Personal history of other specified conditions: Secondary | ICD-10-CM | POA: Diagnosis not present

## 2017-06-08 LAB — PSA: PROSTATE SPECIFIC AG, SERUM: 1.5 ng/mL (ref 0.0–4.0)

## 2017-06-08 NOTE — Progress Notes (Signed)
8:19 AM   Benjamin Potter 05-22-1956 [id number]  Referring provider: Sula Rumple, MD No address on file  Chief Complaint  Patient presents with  . Benign Prostatic Hypertrophy    6 month follow up  . Elevated PSA    HPI: Patient is a 61 year old Caucasian male who presents today for a 6 month follow up for elevated PSA and BPH with LUTS.  History of elevated PSA Patient was found to have an elevated PSA at a wellness visit with his PCP, Dr. Elmer Ramp.  His PSA returned at 4.18ng/mL on 06/04/2015.  A repeated PSA performed on 07/2015 was 3.8 ng/mL.  His PSA is now 1.5 ng/mL on 06/07/2017.  BPH with LUTS His IPSS score today is 10, which is moderate lower urinary tract symptomatology. He is pleased with his quality life due to his urinary symptoms.  His previous I PSS score was 0/0.   He has no complaints at this visit.  He is currently on finasteride 5 mg daily.  He denies any dysuria, hematuria or suprapubic pain. He also denies any recent fevers, chills, nausea or vomiting.  He does not have a family history of PCa.      IPSS    Row Name 06/09/17 0800         International Prostate Symptom Score   How often have you had the sensation of not emptying your bladder? Not at All     How often have you had to urinate less than every two hours? Less than 1 in 5 times     How often have you found you stopped and started again several times when you urinated? More than half the time     How often have you found it difficult to postpone urination? Not at All     How often have you had a weak urinary stream? Not at All     How often have you had to strain to start urination? Not at All     How many times did you typically get up at night to urinate? 5 Times     Total IPSS Score 10       Quality of Life due to urinary symptoms   If you were to spend the rest of your life with your urinary condition just the way it is now how would you feel about that? Pleased        Score:  1-7  Mild 8-19 Moderate 20-35 Severe  Patient states he is suffering with erectile dysfunction and had been receiving Viagra through his primary care physician. Unfortunately this become cost prohibitive. He did ask me about an over-the-counter supplement that he had purchased from [employer] that has claims he can boost year nitrogen oxide naturally.     PMH: Past Medical History:  Diagnosis Date  . Acid reflux   . Diabetes mellitus, type II (HCC)   . Gout   . HLD (hyperlipidemia)   . HTN (hypertension)   . Hypothyroidism     Surgical History: Past Surgical History:  Procedure Laterality Date  . TONSILLECTOMY      Home Medications:  Allergies as of 06/09/2017   No Known Allergies     Medication List       Accurate as of 06/09/17  8:19 AM. Always use your most recent med list.          amLODipine 10 MG tablet Commonly known as:  NORVASC TAKE ONE TABLET BY MOUTH ONCE DAILY  atenolol 50 MG tablet Commonly known as:  TENORMIN TAKE ONE TABLET BY MOUTH ONCE DAILY   benazepril 20 MG tablet Commonly known as:  LOTENSIN Take by mouth.   colchicine 0.6 MG tablet Take 2 tablets (1.2mg ) by mouth at first sign of gout flare followed by 1 tablet (0.6mg ) after 1 hour. (Max 1.8mg  within 1 hour)   diclofenac 75 MG EC tablet Commonly known as:  VOLTAREN diclofenac sodium 75 mg tablet,delayed release  Take 1 tablet twice a day by oral route after meals for 10 days.   finasteride 5 MG tablet Commonly known as:  PROSCAR Take 1 tablet (5 mg total) by mouth daily.   fluticasone 50 MCG/ACT nasal spray Commonly known as:  FLONASE Place into the nose.   levothyroxine 125 MCG tablet Commonly known as:  SYNTHROID, LEVOTHROID TAKE 1 TABLET BY MOUTH ONCE DAILY ON AN EMPTY STOMACH WITH A GLASS OF WATER AT LEAST 30-60 MINUTES BEFORE BREAKFAST   loratadine 10 MG tablet Commonly known as:  CLARITIN Take 10 mg by mouth daily.   metFORMIN 1000 MG tablet Commonly known as:   GLUCOPHAGE TAKE ONE TABLET BY MOUTH TWICE DAILY WITH MEALS   ONE TOUCH ULTRA TEST test strip Generic drug:  glucose blood Use 1 strip via meter twice daily as directed.   predniSONE 20 MG tablet Commonly known as:  DELTASONE Take 3 tablets by mouth on day 1, then take 2 tablets by mouth on days 2-4, then take 1 tablet by mouth on days 5-7   sildenafil 100 MG tablet Commonly known as:  VIAGRA Take by mouth.   simvastatin 20 MG tablet Commonly known as:  ZOCOR Take by mouth.   SOMA 350 MG tablet Generic drug:  carisoprodol Soma 350 mg tablet  Take 1 tablet 3 times a day by oral route as needed.       Allergies: No Known Allergies  Family History: Family History  Problem Relation Age of Onset  . Diabetes Mellitus II Mother   . Stroke Father   . Kidney disease Neg Hx   . Bladder Cancer Neg Hx   . Prostate cancer Neg Hx   . Kidney cancer Neg Hx     Social History:  reports that he has quit smoking. He has never used smokeless tobacco. He reports that he drinks alcohol. He reports that he does not use drugs.  ROS: UROLOGY Frequent Urination?: No Hard to postpone urination?: No Burning/pain with urination?: No Get up at night to urinate?: Yes Leakage of urine?: No Urine stream starts and stops?: Yes Trouble starting stream?: No Do you have to strain to urinate?: No Blood in urine?: No Urinary tract infection?: No Sexually transmitted disease?: No Injury to kidneys or bladder?: No Painful intercourse?: No Weak stream?: No Erection problems?: Yes Penile pain?: No  Gastrointestinal Nausea?: No Vomiting?: No Indigestion/heartburn?: No Diarrhea?: No Constipation?: No  Constitutional Fever: No Night sweats?: No Weight loss?: No Fatigue?: No  Skin Skin rash/lesions?: No Itching?: No  Eyes Blurred vision?: No Double vision?: No  Ears/Nose/Throat Sore throat?: No Sinus problems?: No  Hematologic/Lymphatic Swollen glands?: No Easy bruising?:  No  Cardiovascular Leg swelling?: No Chest pain?: No  Respiratory Cough?: No Shortness of breath?: No  Endocrine Excessive thirst?: No  Musculoskeletal Back pain?: No Joint pain?: No  Neurological Headaches?: No Dizziness?: No  Psychologic Depression?: No Anxiety?: No  Physical Exam: BP (!) 162/96   Pulse (!) 54   Ht 5' 10.5" (1.791 m)  Wt 260 lb 4.8 oz (118.1 kg)   BMI 36.82 kg/m   GU: No CVA tenderness. GU: Patient with circumcised phallus.  Urethral meatus is patent.  No penile discharge. No penile lesions or rashes. Scrotum without lesions, cysts, rashes and/or edema.  Testicles are located scrotally bilaterally. No masses are appreciated in the testicles. Left and right epididymis are normal. Rectal: Patient with  normal sphincter tone. Perineum without scarring or rashes. No rectal masses are appreciated. Prostate is approximately 55 grams, no nodules are appreciated. Seminal vesicles are normal.  Laboratory Data: PSA History  4.18 ng/mL on 05/2015  3.8 ng/mL on 05/2015  4.2 ng/mL on 05/25/2016 - started on finasteride  1.6 ng/mL on 12/03/2016  1.5 ng/mL on 06/07/2017   Assessment & Plan:    1. History of elevated PSA  - PSA reached 4.18 in 05/2015 - now 1.5  - continue finasteride 5 mg daily  - RTC in 6 months for PSA and exam  2. BPH with LUTS  - IPSS score is 10/0, it is worse  - Continue conservative management, avoiding bladder irritants and timed voiding's  - Continue finasteride 5 mg daily; refills given  - RTC in 6 months for IPSS, PSA and exam   3. Erectile dysfunction  - Explained to the patient that I could not comment on the supplement from [employer] as I have not seen any clinical data for studies on the supplement and could not advise him to take it  - Offered the patient to prescription of sildenafil 20 mg and he stated he would like one  - Sildenafil 20 mg, 3 to 5 tablets two hours prior to intercourse on an empty stomach, # 50; he is  warned not to take medications that contain nitrates.  I also advised him of the side effects, such as: headache, flushing, dyspepsia, abnormal vision, nasal congestion, back pain, myalgia, nausea, dizziness, and rash.   Return in about 1 year (around 06/09/2018) for IPSS, PSA and exam.  Michiel CowboySHANNON Stephaniemarie Stoffel, [facility]  [facility] [address], [address] [place], [place] [address] [phone]

## 2017-06-09 ENCOUNTER — Ambulatory Visit (INDEPENDENT_AMBULATORY_CARE_PROVIDER_SITE_OTHER): Payer: 59 | Admitting: Urology

## 2017-06-09 ENCOUNTER — Ambulatory Visit: Payer: 59 | Admitting: Urology

## 2017-06-09 ENCOUNTER — Encounter: Payer: Self-pay | Admitting: Urology

## 2017-06-09 VITALS — BP 162/96 | HR 54 | Ht 70.5 in | Wt 260.3 lb

## 2017-06-09 DIAGNOSIS — Z87898 Personal history of other specified conditions: Secondary | ICD-10-CM | POA: Diagnosis not present

## 2017-06-09 DIAGNOSIS — N401 Enlarged prostate with lower urinary tract symptoms: Secondary | ICD-10-CM | POA: Diagnosis not present

## 2017-06-09 DIAGNOSIS — N138 Other obstructive and reflux uropathy: Secondary | ICD-10-CM | POA: Diagnosis not present

## 2017-06-09 DIAGNOSIS — N529 Male erectile dysfunction, unspecified: Secondary | ICD-10-CM | POA: Diagnosis not present

## 2017-06-09 MED ORDER — SILDENAFIL CITRATE 20 MG PO TABS: ORAL_TABLET | ORAL | 3 refills | Status: DC

## 2017-08-25 DIAGNOSIS — J0181 Other acute recurrent sinusitis: Secondary | ICD-10-CM | POA: Diagnosis not present

## 2017-08-25 DIAGNOSIS — B370 Candidal stomatitis: Secondary | ICD-10-CM | POA: Diagnosis not present

## 2017-08-27 DIAGNOSIS — E785 Hyperlipidemia, unspecified: Secondary | ICD-10-CM | POA: Diagnosis not present

## 2017-08-27 DIAGNOSIS — I10 Essential (primary) hypertension: Secondary | ICD-10-CM | POA: Diagnosis not present

## 2017-08-27 DIAGNOSIS — E119 Type 2 diabetes mellitus without complications: Secondary | ICD-10-CM | POA: Diagnosis not present

## 2017-09-06 DIAGNOSIS — E785 Hyperlipidemia, unspecified: Secondary | ICD-10-CM | POA: Diagnosis not present

## 2017-09-06 DIAGNOSIS — I10 Essential (primary) hypertension: Secondary | ICD-10-CM | POA: Diagnosis not present

## 2017-09-06 DIAGNOSIS — Z1159 Encounter for screening for other viral diseases: Secondary | ICD-10-CM | POA: Diagnosis not present

## 2017-09-06 DIAGNOSIS — E119 Type 2 diabetes mellitus without complications: Secondary | ICD-10-CM | POA: Diagnosis not present

## 2017-12-06 ENCOUNTER — Other Ambulatory Visit: Payer: Self-pay | Admitting: Family Medicine

## 2017-12-06 DIAGNOSIS — R945 Abnormal results of liver function studies: Secondary | ICD-10-CM | POA: Diagnosis not present

## 2017-12-06 DIAGNOSIS — Z789 Other specified health status: Secondary | ICD-10-CM | POA: Diagnosis not present

## 2017-12-06 DIAGNOSIS — E1165 Type 2 diabetes mellitus with hyperglycemia: Secondary | ICD-10-CM | POA: Diagnosis not present

## 2017-12-06 DIAGNOSIS — F1090 Alcohol use, unspecified, uncomplicated: Secondary | ICD-10-CM

## 2017-12-06 DIAGNOSIS — R7989 Other specified abnormal findings of blood chemistry: Secondary | ICD-10-CM

## 2017-12-16 ENCOUNTER — Ambulatory Visit
Admission: RE | Admit: 2017-12-16 | Discharge: 2017-12-16 | Disposition: A | Discharge status: Home / Self Care | Payer: 59 | Source: Ambulatory Visit | Attending: Family Medicine | Admitting: Family Medicine

## 2017-12-16 DIAGNOSIS — Z789 Other specified health status: Secondary | ICD-10-CM | POA: Diagnosis not present

## 2017-12-16 DIAGNOSIS — K760 Fatty (change of) liver, not elsewhere classified: Secondary | ICD-10-CM | POA: Diagnosis not present

## 2017-12-16 DIAGNOSIS — R945 Abnormal results of liver function studies: Secondary | ICD-10-CM | POA: Diagnosis not present

## 2017-12-16 DIAGNOSIS — F1090 Alcohol use, unspecified, uncomplicated: Secondary | ICD-10-CM

## 2017-12-16 DIAGNOSIS — R7989 Other specified abnormal findings of blood chemistry: Secondary | ICD-10-CM

## 2017-12-29 ENCOUNTER — Other Ambulatory Visit: Payer: Self-pay | Admitting: Urology

## 2017-12-29 DIAGNOSIS — M109 Gout, unspecified: Secondary | ICD-10-CM | POA: Diagnosis not present

## 2017-12-29 DIAGNOSIS — Z87898 Personal history of other specified conditions: Secondary | ICD-10-CM

## 2018-01-05 DIAGNOSIS — E1165 Type 2 diabetes mellitus with hyperglycemia: Secondary | ICD-10-CM | POA: Diagnosis not present

## 2018-01-15 ENCOUNTER — Other Ambulatory Visit: Payer: Self-pay | Admitting: Urology

## 2018-02-03 ENCOUNTER — Telehealth: Payer: Self-pay | Admitting: Radiology

## 2018-02-03 ENCOUNTER — Other Ambulatory Visit: Payer: Self-pay | Admitting: Urology

## 2018-02-03 MED ORDER — SILDENAFIL CITRATE 100 MG PO TABS: 100.0000 mg | ORAL_TABLET | Freq: Every day | ORAL | 12 refills | Status: DC | PRN

## 2018-02-03 NOTE — Telephone Encounter (Signed)
Notified pt of script sent to pharmacy. Pt voices understanding & has no further questions at this time.

## 2018-02-03 NOTE — Telephone Encounter (Signed)
Pt wants to change sildenafil Rx from 20mg  to 100mg . [facility] [place].

## 2018-02-03 NOTE — Progress Notes (Signed)
Script for Viagra given.

## 2018-03-21 DIAGNOSIS — R42 Dizziness and giddiness: Secondary | ICD-10-CM | POA: Diagnosis not present

## 2018-03-21 DIAGNOSIS — Z789 Other specified health status: Secondary | ICD-10-CM | POA: Diagnosis not present

## 2018-03-21 DIAGNOSIS — E118 Type 2 diabetes mellitus with unspecified complications: Secondary | ICD-10-CM | POA: Diagnosis not present

## 2018-04-09 DIAGNOSIS — R42 Dizziness and giddiness: Secondary | ICD-10-CM | POA: Diagnosis not present

## 2018-05-27 DIAGNOSIS — M109 Gout, unspecified: Secondary | ICD-10-CM | POA: Diagnosis not present

## 2018-05-27 DIAGNOSIS — E785 Hyperlipidemia, unspecified: Secondary | ICD-10-CM | POA: Diagnosis not present

## 2018-05-27 DIAGNOSIS — E118 Type 2 diabetes mellitus with unspecified complications: Secondary | ICD-10-CM | POA: Diagnosis not present

## 2018-05-27 DIAGNOSIS — I10 Essential (primary) hypertension: Secondary | ICD-10-CM | POA: Diagnosis not present

## 2018-06-05 NOTE — Progress Notes (Signed)
8:50 AM   Benjamin Potter June 06, 1956 [id number]  Referring provider: Sula RumpleVirk, Charanjit, MD No address on file  Chief Complaint  Patient presents with  . Benign Prostatic Hypertrophy    HPI: Patient is a 62 year old Caucasian male who presents today for a 6 month follow up for elevated PSA and BPH with LUTS.  History of elevated PSA PSA Trend  4.18 in June 2016  3.8 in August 2016  3.8 in December 2016  4.2 in March 2017  4.9 in June 2017  2.0 (4.0) in September 2017 - on finasteride  1.6 (3.2) in December 2017  1.5 (3.0) in June 2018  1.4 (2.8) in June 2019   BPH with LUTS His IPSS score today is 4, which is mild lower urinary tract symptomatology.  He is pleased with his quality life due to his urinary symptoms.  His previous I PSS score was 10/1.   He has no complaints at this visit.  He is currently on finasteride 5 mg daily.  He denies any dysuria, hematuria or suprapubic pain. He also denies any recent fevers, chills, nausea or vomiting.  He does not have a family history of PCa.  IPSS    Row Name 06/08/18 0800         International Prostate Symptom Score   How often have you had the sensation of not emptying your bladder?  Not at All     How often have you had to urinate less than every two hours?  Less than 1 in 5 times     How often have you found you stopped and started again several times when you urinated?  Less than 1 in 5 times     How often have you found it difficult to postpone urination?  Not at All     How often have you had a weak urinary stream?  Not at All     How often have you had to strain to start urination?  Not at All     How many times did you typically get up at night to urinate?  2 Times     Total IPSS Score  4       Quality of Life due to urinary symptoms   If you were to spend the rest of your life with your urinary condition just the way it is now how would you feel about that?  Pleased        Score:  1-7 Mild 8-19  Moderate 20-35 Severe  Erectile dysfunction His SHIM score is 19, which is mild ED.   He has been having difficulty with erections for awhile.  His major complaint is maintaining.  His libido is preserved.   His risk factors for ED are age, BPH, DM, HTN and HLD.  He denies any painful erections or curvatures with his erections.   He is still having/no longer having spontaneous erections.  He has tried sildenafil in the past with good results.    SHIM    Row Name 06/08/18 [phone] 06/08/18 0841       SHIM: Over the last 6 months:   How do you rate your confidence that you could get and keep an erection?  Moderate  Moderate    When you had erections with sexual stimulation, how often were your erections hard enough for penetration (entering your partner)?  Most Times (much more than half the time)  Most Times (much more than half the time)  During sexual intercourse, how often were you able to maintain your erection after you had penetrated (entered) your partner?  Most Times (much more than half the time)  Most Times (much more than half the time)    During sexual intercourse, how difficult was it to maintain your erection to completion of intercourse?  Slightly Difficult  Slightly Difficult    When you attempted sexual intercourse, how often was it satisfactory for you?  Most Times (much more than half the time)  Most Times (much more than half the time)      SHIM Total Score   SHIM  19  19       Score: 1-7 Severe ED 8-11 Moderate ED 12-16 Mild-Moderate ED 17-21 Mild ED 22-25 No ED   PMH: Past Medical History:  Diagnosis Date  . Acid reflux   . Diabetes mellitus, type II (HCC)   . Gout   . HLD (hyperlipidemia)   . HTN (hypertension)   . Hypothyroidism     Surgical History: Past Surgical History:  Procedure Laterality Date  . TONSILLECTOMY      Home Medications:  Allergies as of 06/08/2018   No Known Allergies     Medication List        Accurate as of 06/08/18  8:50  AM. Always use your most recent med list.          amLODipine 10 MG tablet Commonly known as:  NORVASC TAKE ONE TABLET BY MOUTH ONCE DAILY   atenolol 50 MG tablet Commonly known as:  TENORMIN TAKE ONE TABLET BY MOUTH ONCE DAILY   benazepril 20 MG tablet Commonly known as:  LOTENSIN Take by mouth.   colchicine 0.6 MG tablet Take 2 tablets (1.2mg ) by mouth at first sign of gout flare followed by 1 tablet (0.6mg ) after 1 hour. (Max 1.8mg  within 1 hour)   diclofenac 75 MG EC tablet Commonly known as:  VOLTAREN diclofenac sodium 75 mg tablet,delayed release  Take 1 tablet twice a day by oral route after meals for 10 days.   finasteride 5 MG tablet Commonly known as:  PROSCAR Take 1 tablet (5 mg total) by mouth daily.   fluticasone 50 MCG/ACT nasal spray Commonly known as:  FLONASE Place into the nose.   levothyroxine 125 MCG tablet Commonly known as:  SYNTHROID, LEVOTHROID TAKE 1 TABLET BY MOUTH ONCE DAILY ON AN EMPTY STOMACH WITH A GLASS OF WATER AT LEAST 30-60 MINUTES BEFORE BREAKFAST   loratadine 10 MG tablet Commonly known as:  CLARITIN Take 10 mg by mouth daily.   metFORMIN 1000 MG tablet Commonly known as:  GLUCOPHAGE TAKE ONE TABLET BY MOUTH TWICE DAILY WITH MEALS   ONE TOUCH ULTRA TEST test strip Generic drug:  glucose blood Use 1 strip via meter twice daily as directed.   sildenafil 20 MG tablet Commonly known as:  REVATIO TAKE 3 TO 5 TABLETS BY MOUTH 2 HOURS BEFORE INTERCOURSE ON AN EMPTY STOMACH. DO NOT TAKE WITH NITRATES.   simvastatin 20 MG tablet Commonly known as:  ZOCOR Take by mouth.   SOMA 350 MG tablet Generic drug:  carisoprodol Soma 350 mg tablet  Take 1 tablet 3 times a day by oral route as needed.   TRADJENTA 5 MG Tabs tablet Generic drug:  linagliptin TAKE 1 TABLET BY MOUTH ONCE DAILY       Allergies: No Known Allergies  Family History: Family History  Problem Relation Age of Onset  . Diabetes Mellitus II Mother   .  Stroke  Father   . Kidney disease Neg Hx   . Bladder Cancer Neg Hx   . Prostate cancer Neg Hx   . Kidney cancer Neg Hx     Social History:  reports that he has quit smoking. He has never used smokeless tobacco. He reports that he drinks alcohol. He reports that he does not use drugs.  ROS: UROLOGY Frequent Urination?: No Hard to postpone urination?: No Burning/pain with urination?: No Get up at night to urinate?: No Leakage of urine?: No Urine stream starts and stops?: No Trouble starting stream?: No Do you have to strain to urinate?: No Blood in urine?: No Urinary tract infection?: No Sexually transmitted disease?: No Injury to kidneys or bladder?: No Painful intercourse?: No Weak stream?: No Erection problems?: No Penile pain?: No  Gastrointestinal Nausea?: No Vomiting?: No Indigestion/heartburn?: No Diarrhea?: No Constipation?: No  Constitutional Fever: No Night sweats?: No Weight loss?: No Fatigue?: No  Skin Skin rash/lesions?: No Itching?: No  Eyes Blurred vision?: No Double vision?: No  Ears/Nose/Throat Sore throat?: No Sinus problems?: No  Hematologic/Lymphatic Swollen glands?: No Easy bruising?: No  Cardiovascular Leg swelling?: No Chest pain?: No  Respiratory Cough?: No Shortness of breath?: No  Endocrine Excessive thirst?: No  Musculoskeletal Back pain?: No Joint pain?: Yes  Neurological Headaches?: No Dizziness?: No  Psychologic Depression?: No Anxiety?: No  Physical Exam: BP (!) 152/85 (BP Location: Right Arm, Patient Position: Sitting, Cuff Size: Normal)   Pulse 61   Ht 5' 10.5" (1.791 m)   Wt 248 lb 12.8 oz (112.9 kg)   BMI 35.19 kg/m   Constitutional: Well nourished. Alert and oriented, No acute distress. HEENT: [place] AT, moist mucus membranes. Trachea midline, no masses. Cardiovascular: No clubbing, cyanosis, or edema. Respiratory: Normal respiratory effort, no increased work of breathing. GI: Abdomen is soft, non  tender, non distended, no abdominal masses. Liver and spleen not palpable.  No hernias appreciated.  Stool sample for occult testing is not indicated.   GU: No CVA tenderness.  No bladder fullness or masses.  Patient with circumcised phallus.    Urethral meatus is patent.  No penile discharge. No penile lesions or rashes. Scrotum without lesions, cysts, rashes and/or edema.  Testicles are located scrotally bilaterally. No masses are appreciated in the testicles. Left and right epididymis are normal. Rectal: Patient with  normal sphincter tone. Anus and perineum without scarring or rashes. No rectal masses are appreciated. Prostate is approximately 55 grams, no nodules are appreciated. Seminal vesicles are normal. Skin: No rashes, bruises or suspicious lesions. Lymph: No cervical or inguinal adenopathy. Neurologic: Grossly intact, no focal deficits, moving all 4 extremities. Psychiatric: Normal mood and affect.   Laboratory Data: PSA Trend in HPI  Assessment & Plan:    1. History of elevated PSA PSA reached 4.18 in 05/2015 - started on finasteride - PSA reduced - current PSA 1.4 (2.8) Continue finasteride 5 mg daily RTC in 6 months for PSA - patient would like to have PSA drawn at the time of his appointment due to work constraints  2. BPH with LUTS IPSS score is 4/1, it is improving Continue conservative management, avoiding bladder irritants and timed voiding's Continue finasteride 5 mg daily; refills given RTC in 6 months for IPSS, PSA and exam   3. Erectile dysfunction SHIM score is 19 Continue sildenafil 20 mg, 3 to 5 tablets  RTC in 6 months for SHIM score and exam  Return in about 6 months (around 12/08/2018) for IPSS,  SHIM, PSA and exam.  Michiel Cowboy, [facility]  [facility] [address] [place], [place] [address] [phone]

## 2018-06-07 ENCOUNTER — Other Ambulatory Visit: Payer: 59

## 2018-06-07 DIAGNOSIS — Z87898 Personal history of other specified conditions: Secondary | ICD-10-CM | POA: Diagnosis not present

## 2018-06-08 ENCOUNTER — Encounter: Payer: Self-pay | Admitting: Urology

## 2018-06-08 ENCOUNTER — Ambulatory Visit: Payer: 59 | Admitting: Urology

## 2018-06-08 VITALS — BP 152/85 | HR 61 | Ht 70.5 in | Wt 248.8 lb

## 2018-06-08 DIAGNOSIS — N401 Enlarged prostate with lower urinary tract symptoms: Secondary | ICD-10-CM | POA: Diagnosis not present

## 2018-06-08 DIAGNOSIS — Z87898 Personal history of other specified conditions: Secondary | ICD-10-CM | POA: Diagnosis not present

## 2018-06-08 DIAGNOSIS — N138 Other obstructive and reflux uropathy: Secondary | ICD-10-CM | POA: Diagnosis not present

## 2018-06-08 DIAGNOSIS — N529 Male erectile dysfunction, unspecified: Secondary | ICD-10-CM | POA: Diagnosis not present

## 2018-06-08 LAB — PSA: Prostate Specific Ag, Serum: 1.4 ng/mL (ref 0.0–4.0)

## 2018-06-08 MED ORDER — FINASTERIDE 5 MG PO TABS: 5.0000 mg | ORAL_TABLET | Freq: Every day | ORAL | 3 refills | Status: DC

## 2018-06-09 DIAGNOSIS — M545 Low back pain: Secondary | ICD-10-CM | POA: Diagnosis not present

## 2018-06-14 DIAGNOSIS — M5442 Lumbago with sciatica, left side: Secondary | ICD-10-CM | POA: Diagnosis not present

## 2018-06-14 DIAGNOSIS — M9903 Segmental and somatic dysfunction of lumbar region: Secondary | ICD-10-CM | POA: Diagnosis not present

## 2018-06-14 DIAGNOSIS — M9904 Segmental and somatic dysfunction of sacral region: Secondary | ICD-10-CM | POA: Diagnosis not present

## 2018-11-01 DIAGNOSIS — M5416 Radiculopathy, lumbar region: Secondary | ICD-10-CM | POA: Diagnosis not present

## 2018-11-01 DIAGNOSIS — M545 Low back pain: Secondary | ICD-10-CM | POA: Diagnosis not present

## 2018-12-01 DIAGNOSIS — E1169 Type 2 diabetes mellitus with other specified complication: Secondary | ICD-10-CM | POA: Diagnosis not present

## 2018-12-01 DIAGNOSIS — E039 Hypothyroidism, unspecified: Secondary | ICD-10-CM | POA: Diagnosis not present

## 2018-12-01 DIAGNOSIS — E113299 Type 2 diabetes mellitus with mild nonproliferative diabetic retinopathy without macular edema, unspecified eye: Secondary | ICD-10-CM | POA: Diagnosis not present

## 2018-12-06 DIAGNOSIS — E113299 Type 2 diabetes mellitus with mild nonproliferative diabetic retinopathy without macular edema, unspecified eye: Secondary | ICD-10-CM | POA: Diagnosis not present

## 2018-12-06 DIAGNOSIS — K760 Fatty (change of) liver, not elsewhere classified: Secondary | ICD-10-CM | POA: Diagnosis not present

## 2018-12-06 DIAGNOSIS — D649 Anemia, unspecified: Secondary | ICD-10-CM | POA: Diagnosis not present

## 2018-12-06 DIAGNOSIS — Z79899 Other long term (current) drug therapy: Secondary | ICD-10-CM | POA: Diagnosis not present

## 2018-12-07 DIAGNOSIS — N183 Chronic kidney disease, stage 3 unspecified: Secondary | ICD-10-CM | POA: Insufficient documentation

## 2018-12-08 ENCOUNTER — Ambulatory Visit: Payer: 59 | Admitting: Urology

## 2018-12-08 ENCOUNTER — Encounter: Payer: Self-pay | Admitting: Urology

## 2018-12-08 VITALS — BP 160/82 | HR 64 | Ht 70.5 in | Wt 253.3 lb

## 2018-12-08 DIAGNOSIS — N529 Male erectile dysfunction, unspecified: Secondary | ICD-10-CM | POA: Diagnosis not present

## 2018-12-08 DIAGNOSIS — N401 Enlarged prostate with lower urinary tract symptoms: Secondary | ICD-10-CM | POA: Diagnosis not present

## 2018-12-08 DIAGNOSIS — Z87898 Personal history of other specified conditions: Secondary | ICD-10-CM | POA: Diagnosis not present

## 2018-12-08 DIAGNOSIS — N138 Other obstructive and reflux uropathy: Secondary | ICD-10-CM | POA: Diagnosis not present

## 2018-12-08 MED ORDER — FINASTERIDE 5 MG PO TABS: 5.0000 mg | ORAL_TABLET | Freq: Every day | ORAL | 3 refills | Status: DC

## 2018-12-08 NOTE — Progress Notes (Signed)
8:52 AM   Benjamin Potter 21-Dec-1956 [id number]  Referring provider: Cheryll Potter, Katherine, FNP No address on file  Chief Complaint  Patient presents with  . Benign Prostatic Hypertrophy    HPI: Patient is a 62 y.o. Caucasian male who presents today for a 6 month follow up for a history of  elevated PSA,  BPH with LU TS and ED.    History of elevated PSA PSA Trend  4.18 in June 2016  3.8 in August 2016  3.8 in December 2016  4.2 in March 2017  4.9 in June 2017  2.0 (4.0) in September 2017 - on finasteride  1.6 (3.2) in December 2017  1.5 (3.0) in June 2018  1.4 (2.8) in June 2019   BPH with LUTS His IPSS score today is 2/1. He is pleased with his quality of life due to his urinary symptoms. His previous IPSS score was 4/1. His is continuing on finasteride 5 mg daily without any issues. He denies any dysuria, hematuria, fevers, chills, nausea or vomiting. He does not have a family history of Prostate Cancer.   IPSS    Row Name 12/08/18 0800         International Prostate Symptom Score   How often have you had the sensation of not emptying your bladder?  Not at All     How often have you had to urinate less than every two hours?  Less than 1 in 5 times     How often have you found you stopped and started again several times when you urinated?  Not at All     How often have you found it difficult to postpone urination?  Not at All     How often have you had a weak urinary stream?  Not at All     How often have you had to strain to start urination?  Not at All     How many times did you typically get up at night to urinate?  1 Time     Total IPSS Score  2       Quality of Life due to urinary symptoms   If you were to spend the rest of your life with your urinary condition just the way it is now how would you feel about that?  Pleased        Score:  1-7 Mild 8-19 Moderate 20-35 Severe  Erectile dysfunction His SHIM score is 14, which is mild ED. His erections have  not been painful and they do not curve. He will sometimes have spontaneous erections at night. He continues on slidenafil with good tolerance.   SHIM    Row Name 12/08/18 [id number]         SHIM: Over the last 6 months:   How do you rate your confidence that you could get and keep an erection?  Low     When you had erections with sexual stimulation, how often were your erections hard enough for penetration (entering your partner)?  A Few Times (much less than half the time)     During sexual intercourse, how often were you able to maintain your erection after you had penetrated (entered) your partner?  Sometimes (about half the time)     During sexual intercourse, how difficult was it to maintain your erection to completion of intercourse?  Slightly Difficult     When you attempted sexual intercourse, how often was it satisfactory for you?  Sometimes (about half the time)  SHIM Total Score   SHIM  14        Score: 1-7 Severe ED 8-11 Moderate ED 12-16 Mild-Moderate ED 17-21 Mild ED 22-25 No ED   PMH: Past Medical History:  Diagnosis Date  . Acid reflux   . Diabetes mellitus, type II (HCC)   . Gout   . HLD (hyperlipidemia)   . HTN (hypertension)   . Hypothyroidism     Surgical History: Past Surgical History:  Procedure Laterality Date  . TONSILLECTOMY      Home Medications:  Allergies as of 12/08/2018   No Known Allergies     Medication List       Accurate as of December 08, 2018  8:52 AM. Always use your most recent med list.        amLODipine 10 MG tablet Commonly known as:  NORVASC TAKE ONE TABLET BY MOUTH ONCE DAILY   atenolol 50 MG tablet Commonly known as:  TENORMIN TAKE ONE TABLET BY MOUTH ONCE DAILY   benazepril 20 MG tablet Commonly known as:  LOTENSIN Take by mouth.   colchicine 0.6 MG tablet Take 2 tablets (1.2mg ) by mouth at first sign of gout flare followed by 1 tablet (0.6mg ) after 1 hour. (Max 1.8mg  within 1 hour)   diclofenac 75 MG  EC tablet Commonly known as:  VOLTAREN diclofenac sodium 75 mg tablet,delayed release  Take 1 tablet twice a day by oral route after meals for 10 days.   finasteride 5 MG tablet Commonly known as:  PROSCAR Take 1 tablet (5 mg total) by mouth daily.   fluticasone 50 MCG/ACT nasal spray Commonly known as:  FLONASE Place into the nose.   levothyroxine 125 MCG tablet Commonly known as:  SYNTHROID, LEVOTHROID TAKE 1 TABLET BY MOUTH ONCE DAILY ON AN EMPTY STOMACH WITH A GLASS OF WATER AT LEAST 30-60 MINUTES BEFORE BREAKFAST   loratadine 10 MG tablet Commonly known as:  CLARITIN Take 10 mg by mouth daily.   metFORMIN 1000 MG tablet Commonly known as:  GLUCOPHAGE TAKE ONE TABLET BY MOUTH TWICE DAILY WITH MEALS   ONE TOUCH ULTRA TEST test strip Generic drug:  glucose blood Use 1 strip via meter twice daily as directed.   sildenafil 20 MG tablet Commonly known as:  REVATIO TAKE 3 TO 5 TABLETS BY MOUTH 2 HOURS BEFORE INTERCOURSE ON AN EMPTY STOMACH. DO NOT TAKE WITH NITRATES.   simvastatin 20 MG tablet Commonly known as:  ZOCOR Take by mouth.   SOMA 350 MG tablet Generic drug:  carisoprodol Soma 350 mg tablet  Take 1 tablet 3 times a day by oral route as needed.   TRADJENTA 5 MG Tabs tablet Generic drug:  linagliptin TAKE 1 TABLET BY MOUTH ONCE DAILY       Allergies: No Known Allergies  Family History: Family History  Problem Relation Age of Onset  . Diabetes Mellitus II Mother   . Stroke Father   . Kidney disease Neg Hx   . Bladder Cancer Neg Hx   . Prostate cancer Neg Hx   . Kidney cancer Neg Hx     Social History:  reports that he has quit smoking. He has never used smokeless tobacco. He reports current alcohol use. He reports that he does not use drugs.  ROS: UROLOGY Frequent Urination?: No Hard to postpone urination?: No Burning/pain with urination?: No Get up at night to urinate?: No Leakage of urine?: No Urine stream starts and stops?: No Trouble  starting stream?: No Do  you have to strain to urinate?: No Blood in urine?: No Urinary tract infection?: No Sexually transmitted disease?: No Injury to kidneys or bladder?: No Painful intercourse?: No Weak stream?: No Erection problems?: No Penile pain?: No  Gastrointestinal Nausea?: No Vomiting?: No Indigestion/heartburn?: No Diarrhea?: No Constipation?: No  Constitutional Fever: No Night sweats?: No Weight loss?: No Fatigue?: No  Skin Skin rash/lesions?: No Itching?: No  Eyes Blurred vision?: No Double vision?: No  Ears/Nose/Throat Sore throat?: No Sinus problems?: No  Hematologic/Lymphatic Swollen glands?: No Easy bruising?: No  Cardiovascular Leg swelling?: No Chest pain?: No  Respiratory Cough?: No Shortness of breath?: No  Endocrine Excessive thirst?: No  Musculoskeletal Back pain?: Yes Joint pain?: No  Neurological Headaches?: No Dizziness?: No  Psychologic Depression?: No Anxiety?: No  Physical Exam: BP (!) 160/82 (BP Location: Left Arm, Patient Position: Sitting, Cuff Size: Large)   Pulse 64   Ht 5' 10.5" (1.791 m)   Wt 253 lb 4.8 oz (114.9 kg)   BMI 35.83 kg/m   Constitutional: Well nourished. Alert and oriented, No acute distress.  GU: No CVA tenderness.  No bladder fullness or masses. Patient with circumcised phallus. Urethral meatus is patent. No penile discharge. No penile lesions or rashes. Scrotum without lesions, cysts, rashes and/or edema. Testicles are located scrotally bilaterally. No masses are appreciated in the testicles. Left and right epididymis are normal. Rectal: Patient with normal sphincter tone. Anus and perineum without scarring or rashes. No rectal masses are appreciated. Prostate is approximately 50 grams, no nodules are appreciated. Seminal vesicles are normal. Skin: No rashes, bruises or suspicious lesions. Lymph: No cervical or inguinal adenopathy. Neurologic: Grossly intact, no focal deficits, moving all  4 extremities. Psychiatric: Normal mood and affect.   Laboratory Data: PSA Trend in HPI I have reviewed the labs.  Assessment & Plan:    1. History of elevated PSA PSA reached 4.18 in 05/2015 - started on finasteride - PSA reduced - current PSA 1.4 (06/07/2018) Continue finasteride 5 mg daily RTC in 6 months for PSA - if PSA is stable - patient would like to have PSA drawn at the time of his appointment due to work constraints  2. BPH with LUTS IPSS score is 2/1, it is improving Continue conservative management, avoiding bladder irritants and timed voiding's Continue finasteride 5 mg daily; refills given RTC in 6 months for IPSS, PSA and exam  3. Erectile dysfunction SHIM score is 14, it is slightly worse  Continue sildenafil 20 mg, 3 to 5 tablets  RTC in 6 months for SHIM score and exam   Return in about 6 months (around 06/09/2019) for IPSS, PSA, SHIM, and exam.  Michiel Cowboy, [facility]  [facility] [address] [place], [place] [address] [phone]   I, Soijett T[place], am acting as a Neurosurgeon for [employer], PA-C.   I have reviewed the above documentation for accuracy and completeness, and I agree with the above.    Michiel Cowboy, PA-C

## 2018-12-09 ENCOUNTER — Telehealth: Payer: Self-pay

## 2018-12-09 LAB — PSA: PROSTATE SPECIFIC AG, SERUM: 1.3 ng/mL (ref 0.0–4.0)

## 2018-12-09 NOTE — Telephone Encounter (Signed)
Called pt no answer. LM for pt informing him of the information below. Advised pt to call back for questions or concerns.  

## 2018-12-09 NOTE — Telephone Encounter (Signed)
-----   Message from Harle BattiestShannon A McGowan, PA-C sent at 12/09/2018  7:39 AM EST ----- Please let Mr. Benjamin Potter know that his PSA is stable.  We will see him in 6 months.

## 2019-01-04 DIAGNOSIS — B370 Candidal stomatitis: Secondary | ICD-10-CM | POA: Diagnosis not present

## 2019-01-04 DIAGNOSIS — J0181 Other acute recurrent sinusitis: Secondary | ICD-10-CM | POA: Diagnosis not present

## 2019-01-24 ENCOUNTER — Other Ambulatory Visit: Payer: Self-pay | Admitting: Urology

## 2019-06-07 ENCOUNTER — Ambulatory Visit: Payer: 59 | Admitting: Urology

## 2019-06-20 NOTE — Progress Notes (Signed)
8:36 AM   Benjamin SayerEddie Potter 09/09/1956 [id number]  Referring provider: [name], Katherine, FNP No address on file  Chief Complaint  Patient presents with  . Elevated PSA    HPI: Patient is a 63 y.o. Caucasian male who presents today for a 6 month follow up for a history of  elevated PSA,  BPH with LU TS and ED.    History of elevated PSA PSA Trend  4.18 in June 2016  3.8 in August 2016  3.8 in December 2016  4.2 in March 2017  4.9 in June 2017  2.0 (4.0) in September 2017 - on finasteride  1.6 (3.2) in December 2017  1.5 (3.0) in June 2018  1.4 (2.8) in June 2019  1.3 (2.6) in December 2019  BPH with LUTS His IPSS score today is 2, which is mild LUTS.  He is pleased with his quality of life due to his urinary symptoms. His previous IPSS score was 2/1. His is continuing on finasteride 5 mg daily without any issues. He denies any dysuria, hematuria, fevers, chills, nausea or vomiting. He does not have a family history of prostate cancer.  IPSS    Row Name 06/21/19 0800         International Prostate Symptom Score   How often have you had the sensation of not emptying your bladder?  Not at All     How often have you had to urinate less than every two hours?  Less than 1 in 5 times     How often have you found you stopped and started again several times when you urinated?  Not at All     How often have you found it difficult to postpone urination?  Not at All     How often have you had a weak urinary stream?  Not at All     How often have you had to strain to start urination?  Not at All     How many times did you typically get up at night to urinate?  1 Time     Total IPSS Score  2       Quality of Life due to urinary symptoms   If you were to spend the rest of your life with your urinary condition just the way it is now how would you feel about that?  Pleased        Score:  1-7 Mild 8-19 Moderate 20-35 Severe  Erectile dysfunction His SHIM score is 18, which is  mild ED. His previous SHIM score was 14.  His erections have not been painful and they do not curve. He will sometimes have spontaneous erections at night. He continues on slidenafil with good tolerance.  SHIM    Row Name 06/21/19 0815         SHIM: Over the last 6 months:   How do you rate your confidence that you could get and keep an erection?  Moderate     When you had erections with sexual stimulation, how often were your erections hard enough for penetration (entering your partner)?  Most Times (much more than half the time)     During sexual intercourse, how often were you able to maintain your erection after you had penetrated (entered) your partner?  Sometimes (about half the time)     During sexual intercourse, how difficult was it to maintain your erection to completion of intercourse?  Slightly Difficult     When you attempted sexual intercourse,  how often was it satisfactory for you?  Most Times (much more than half the time)       SHIM Total Score   SHIM  18        Score: 1-7 Severe ED 8-11 Moderate ED 12-16 Mild-Moderate ED 17-21 Mild ED 22-25 No ED   PMH: Past Medical History:  Diagnosis Date  . Acid reflux   . Diabetes mellitus, type II ([place])   . Gout   . HLD (hyperlipidemia)   . HTN (hypertension)   . Hypothyroidism     Surgical History: Past Surgical History:  Procedure Laterality Date  . TONSILLECTOMY      Home Medications:  Allergies as of 06/21/2019   No Known Allergies     Medication List       Accurate as of June 21, 2019  8:36 AM. If you have any questions, ask your nurse or doctor.        amLODipine 10 MG tablet Commonly known as: NORVASC TAKE ONE TABLET BY MOUTH ONCE DAILY   atenolol 50 MG tablet Commonly known as: TENORMIN TAKE ONE TABLET BY MOUTH ONCE DAILY   benazepril 20 MG tablet Commonly known as: LOTENSIN Take by mouth.   colchicine 0.6 MG tablet Take 2 tablets (1.2mg ) by mouth at first sign of gout flare followed by 1  tablet (0.6mg ) after 1 hour. (Max 1.8mg  within 1 hour)   diclofenac 75 MG EC tablet Commonly known as: VOLTAREN diclofenac sodium 75 mg tablet,delayed release  Take 1 tablet twice a day by oral route after meals for 10 days.   finasteride 5 MG tablet Commonly known as: PROSCAR Take 1 tablet (5 mg total) by mouth daily.   fluticasone 50 MCG/ACT nasal spray Commonly known as: FLONASE Place into the nose.   levothyroxine 125 MCG tablet Commonly known as: SYNTHROID TAKE 1 TABLET BY MOUTH ONCE DAILY ON AN EMPTY STOMACH WITH A GLASS OF WATER AT LEAST 30-60 MINUTES BEFORE BREAKFAST   loratadine 10 MG tablet Commonly known as: CLARITIN Take 10 mg by mouth daily.   metFORMIN 1000 MG tablet Commonly known as: GLUCOPHAGE TAKE ONE TABLET BY MOUTH TWICE DAILY WITH MEALS   ONE TOUCH ULTRA TEST test strip Generic drug: glucose blood Use 1 strip via meter twice daily as directed.   sildenafil 20 MG tablet Commonly known as: REVATIO TAKE 3 TO 5 TABLETS BY MOUTH 2 HOURS BEFORE INTERCOURSE ON EMPTY STOMACH   simvastatin 20 MG tablet Commonly known as: ZOCOR Take by mouth.   Soma 350 MG tablet Generic drug: carisoprodol Soma 350 mg tablet  Take 1 tablet 3 times a day by oral route as needed.   Tradjenta 5 MG Tabs tablet Generic drug: linagliptin TAKE 1 TABLET BY MOUTH ONCE DAILY       Allergies: No Known Allergies  Family History: Family History  Problem Relation Age of Onset  . Diabetes Mellitus II Mother   . Stroke Father   . Kidney disease Neg Hx   . Bladder Cancer Neg Hx   . Prostate cancer Neg Hx   . Kidney cancer Neg Hx     Social History:  reports that he has quit smoking. He has never used smokeless tobacco. He reports current alcohol use. He reports that he does not use drugs.  ROS: UROLOGY Frequent Urination?: No Hard to postpone urination?: No Burning/pain with urination?: No Get up at night to urinate?: Yes Leakage of urine?: No Urine stream starts and  stops?: No Trouble  starting stream?: No Do you have to strain to urinate?: No Blood in urine?: No Urinary tract infection?: No Sexually transmitted disease?: No Injury to kidneys or bladder?: No Painful intercourse?: No Weak stream?: No Erection problems?: No Penile pain?: No  Gastrointestinal Nausea?: No Vomiting?: No Indigestion/heartburn?: No Diarrhea?: No Constipation?: No  Constitutional Fever: No Night sweats?: No Weight loss?: No Fatigue?: No  Skin Skin rash/lesions?: No Itching?: No  Eyes Blurred vision?: Yes Double vision?: No  Ears/Nose/Throat Sore throat?: No Sinus problems?: No  Hematologic/Lymphatic Swollen glands?: No Easy bruising?: No  Cardiovascular Leg swelling?: No Chest pain?: No  Respiratory Cough?: No Shortness of breath?: No  Endocrine Excessive thirst?: No  Musculoskeletal Back pain?: Yes Joint pain?: No  Neurological Headaches?: No Dizziness?: No  Psychologic Depression?: No Anxiety?: No  Physical Exam: BP (!) 162/97 (BP Location: Left Arm, Patient Position: Sitting, Cuff Size: Normal)   Pulse 65   Ht 5' 10.5" (1.791 m)   Wt 251 lb (113.9 kg)   BMI 35.51 kg/m   Constitutional:  Well nourished. Alert and oriented, No acute distress. HEENT: [place] AT, moist mucus membranes.  Trachea midline, no masses. Cardiovascular: No clubbing, cyanosis, or edema. Respiratory: Normal respiratory effort, no increased work of breathing. GI: Abdomen is soft, non tender, non distended, no abdominal masses. Liver and spleen not palpable.  No hernias appreciated.  Stool sample for occult testing is not indicated.   GU: No CVA tenderness.  No bladder fullness or masses.  Patient with circumcised phallus. Urethral meatus is patent.  No penile discharge. No penile lesions or rashes. Scrotum without lesions, cysts, rashes and/or edema.  Testicles are located scrotally bilaterally. No masses are appreciated in the testicles. Left and right  epididymis are normal. Rectal: Patient with  normal sphincter tone. Anus and perineum without scarring or rashes. No rectal masses are appreciated. Prostate is approximately 40 grams, could only palpate apex and midportion of the gland, no nodules are appreciated. Skin: No rashes, bruises or suspicious lesions. Lymph: No inguinal adenopathy. Neurologic: Grossly intact, no focal deficits, moving all 4 extremities. Psychiatric: Normal mood and affect.  Laboratory Data: PSA Trend in HPI I have reviewed the labs.  Assessment & Plan:    1. History of elevated PSA PSA reached 4.18 in 05/2015 - started on finasteride - PSA reduced - current PSA 1.3 - corrected 2.6  (11/2018) Continue finasteride 5 mg daily RTC in 12 months for PSA - if PSA is stable - patient would like to have PSA drawn at the time of his appointment due to work constraints  2. BPH with LUTS IPSS score is 2/1, it is stable Continue conservative management, avoiding bladder irritants and timed voiding's Continue finasteride 5 mg daily - prescription given RTC in 6 months for IPSS, PSA and exam - if PSA is stable  3. Erectile dysfunction SHIM score is 18, it is improved  Continue sildenafil 20 mg, 3 to 5 tablets - prescription given  RTC in 12 months for SHIM score and exam   Return in about 1 year (around 06/20/2020) for IPSS, SHIM, PSA and exam.  Michiel CowboySHANNON Haydin Dunn, [facility] Care[facility]-C  [facility] [address] [place], [place] [address] [phone]

## 2019-06-21 ENCOUNTER — Encounter: Payer: Self-pay | Admitting: Urology

## 2019-06-21 ENCOUNTER — Other Ambulatory Visit: Payer: Self-pay

## 2019-06-21 ENCOUNTER — Ambulatory Visit: Payer: 59 | Admitting: Urology

## 2019-06-21 VITALS — BP 162/97 | HR 65 | Ht 70.5 in | Wt 251.0 lb

## 2019-06-21 DIAGNOSIS — N138 Other obstructive and reflux uropathy: Secondary | ICD-10-CM | POA: Diagnosis not present

## 2019-06-21 DIAGNOSIS — N529 Male erectile dysfunction, unspecified: Secondary | ICD-10-CM | POA: Diagnosis not present

## 2019-06-21 DIAGNOSIS — Z87898 Personal history of other specified conditions: Secondary | ICD-10-CM | POA: Diagnosis not present

## 2019-06-21 DIAGNOSIS — N401 Enlarged prostate with lower urinary tract symptoms: Secondary | ICD-10-CM | POA: Diagnosis not present

## 2019-06-21 MED ORDER — SILDENAFIL CITRATE 20 MG PO TABS: ORAL_TABLET | ORAL | 3 refills | Status: DC

## 2019-06-21 MED ORDER — FINASTERIDE 5 MG PO TABS: 5.0000 mg | ORAL_TABLET | Freq: Every day | ORAL | 3 refills | Status: DC

## 2019-06-22 ENCOUNTER — Telehealth: Payer: Self-pay

## 2019-06-22 LAB — PSA: Prostate Specific Ag, Serum: 1.7 ng/mL (ref 0.0–4.0)

## 2019-06-22 NOTE — Telephone Encounter (Signed)
-----   Message from Nori Riis, PA-C sent at 06/22/2019  7:28 AM EDT ----- Please let Mr. Meister know that his PSA is stable at 1.7.  We will see him next year.

## 2019-06-22 NOTE — Telephone Encounter (Signed)
Called pt no answer. Left detailed message for pt informing him of the information below. Advised pt to call back for questions or concerns.  

## 2020-06-25 ENCOUNTER — Telehealth: Payer: Self-pay | Admitting: Family Medicine

## 2020-06-25 ENCOUNTER — Other Ambulatory Visit: Payer: Self-pay | Admitting: *Deleted

## 2020-06-25 DIAGNOSIS — N138 Other obstructive and reflux uropathy: Secondary | ICD-10-CM

## 2020-06-25 NOTE — Telephone Encounter (Signed)
Patient returned call, appointment for lab was scheduled.

## 2020-06-25 NOTE — Telephone Encounter (Signed)
LMOM for patient to return call. He needs to schedule a lab appointment before 07/09/20.

## 2020-07-01 ENCOUNTER — Ambulatory Visit: Payer: 59 | Admitting: Urology

## 2020-07-01 ENCOUNTER — Other Ambulatory Visit: Payer: 59

## 2020-07-01 ENCOUNTER — Other Ambulatory Visit: Payer: Self-pay

## 2020-07-01 DIAGNOSIS — N401 Enlarged prostate with lower urinary tract symptoms: Secondary | ICD-10-CM

## 2020-07-01 DIAGNOSIS — N138 Other obstructive and reflux uropathy: Secondary | ICD-10-CM

## 2020-07-02 LAB — PSA: Prostate Specific Ag, Serum: 2.4 ng/mL (ref 0.0–4.0)

## 2020-07-09 ENCOUNTER — Ambulatory Visit: Payer: 59 | Admitting: Urology

## 2020-07-09 ENCOUNTER — Encounter: Payer: Self-pay | Admitting: Urology

## 2020-07-09 ENCOUNTER — Other Ambulatory Visit: Payer: Self-pay

## 2020-07-09 VITALS — BP 169/90 | HR 60 | Ht 70.5 in | Wt 246.0 lb

## 2020-07-09 DIAGNOSIS — N138 Other obstructive and reflux uropathy: Secondary | ICD-10-CM

## 2020-07-09 DIAGNOSIS — N529 Male erectile dysfunction, unspecified: Secondary | ICD-10-CM | POA: Diagnosis not present

## 2020-07-09 DIAGNOSIS — N401 Enlarged prostate with lower urinary tract symptoms: Secondary | ICD-10-CM

## 2020-07-09 DIAGNOSIS — R972 Elevated prostate specific antigen [PSA]: Secondary | ICD-10-CM

## 2020-07-09 LAB — URINALYSIS, COMPLETE
Bilirubin, UA: NEGATIVE
Glucose, UA: NEGATIVE
Ketones, UA: NEGATIVE
Leukocytes,UA: NEGATIVE
Nitrite, UA: NEGATIVE
Protein,UA: NEGATIVE
RBC, UA: NEGATIVE
Specific Gravity, UA: 1.025 (ref 1.005–1.030)
Urobilinogen, Ur: 0.2 mg/dL (ref 0.2–1.0)
pH, UA: 5 (ref 5.0–7.5)

## 2020-07-09 LAB — MICROSCOPIC EXAMINATION: Bacteria, UA: NONE SEEN

## 2020-07-09 NOTE — Progress Notes (Signed)
8:23 AM   Benjamin Potter 07/31/1956 [id number]  Referring provider: Cheryll Dessert, FNP No address on file  Chief Complaint  Patient presents with  . Benign Prostatic Hypertrophy    HPI: Patient is a 64 y.o. male who presents today for a 6 month follow up for a history of  elevated PSA,  BPH with LU TS and ED.    History of elevated PSA PSA Trend  4.18 in June 2016  3.8 in August 2016  3.8 in December 2016  4.2 in March 2017  4.9 in June 2017  2.0 (4.0) in September 2017 - on finasteride  1.6 (3.2) in December 2017  1.5 (3.0) in June 2018  1.4 (2.8) in June 2019  1.3 (2.6) in December 2019  1.7 (3.4) in July 2020  2.4 (4.8) in July 2021  BPH with LUTS His IPSS score today is 3, which is mild LUTS.  He is pleased with his quality of life due to his urinary symptoms. His previous IPSS score was 2/1. His is continuing on finasteride 5 mg daily without any issues. He denies any dysuria, hematuria, fevers, chills, nausea or vomiting. He does not have a family history of prostate cancer.  UA is unremarkable.    IPSS    Row Name 07/09/20 0800         International Prostate Symptom Score   How often have you had the sensation of not emptying your bladder? Not at All     How often have you had to urinate less than every two hours? Not at All     How often have you found you stopped and started again several times when you urinated? Less than 1 in 5 times     How often have you found it difficult to postpone urination? Less than 1 in 5 times     How often have you had a weak urinary stream? Not at All     How often have you had to strain to start urination? Not at All     How many times did you typically get up at night to urinate? 1 Time     Total IPSS Score 3       Quality of Life due to urinary symptoms   If you were to spend the rest of your life with your urinary condition just the way it is now how would you feel about that? Pleased            Score:  1-7  Mild 8-19 Moderate 20-35 Severe  Erectile dysfunction His SHIM score is 17, which is mild ED. His previous SHIM score was 18.  His erections have not been painful and they do not curve. He will sometimes have spontaneous erections at night. He continues on slidenafil with good tolerance.    SHIM    Row Name 07/09/20 0818         SHIM: Over the last 6 months:   How do you rate your confidence that you could get and keep an erection? Low     When you had erections with sexual stimulation, how often were your erections hard enough for penetration (entering your partner)? Sometimes (about half the time)     During sexual intercourse, how often were you able to maintain your erection after you had penetrated (entered) your partner? Most Times (much more than half the time)     During sexual intercourse, how difficult was it to maintain your erection to  completion of intercourse? Slightly Difficult     When you attempted sexual intercourse, how often was it satisfactory for you? Most Times (much more than half the time)       SHIM Total Score   SHIM 17            Score: 1-7 Severe ED 8-11 Moderate ED 12-16 Mild-Moderate ED 17-21 Mild ED 22-25 No ED   PMH: Past Medical History:  Diagnosis Date  . Acid reflux   . Diabetes mellitus, type II ([facility])   . Gout   . HLD (hyperlipidemia)   . HTN (hypertension)   . Hypothyroidism     Surgical History: Past Surgical History:  Procedure Laterality Date  . TONSILLECTOMY      Home Medications:  Allergies as of 07/09/2020      Reactions   Shellfish-derived Products Anaphylaxis      Medication List       Accurate as of July 09, 2020  8:23 AM. If you have any questions, ask your nurse or doctor.        amLODipine 10 MG tablet Commonly known as: NORVASC TAKE ONE TABLET BY MOUTH ONCE DAILY   atenolol 50 MG tablet Commonly known as: TENORMIN TAKE ONE TABLET BY MOUTH ONCE DAILY   benazepril 20 MG tablet Commonly known as:  LOTENSIN Take by mouth.   colchicine 0.6 MG tablet Take 2 tablets (1.2mg ) by mouth at first sign of gout flare followed by 1 tablet (0.6mg ) after 1 hour. (Max 1.8mg  within 1 hour)   diclofenac 75 MG EC tablet Commonly known as: VOLTAREN diclofenac sodium 75 mg tablet,delayed release  Take 1 tablet twice a day by oral route after meals for 10 days.   finasteride 5 MG tablet Commonly known as: PROSCAR Take 1 tablet (5 mg total) by mouth daily.   fluticasone 50 MCG/ACT nasal spray Commonly known as: FLONASE Place into the nose.   levothyroxine 125 MCG tablet Commonly known as: SYNTHROID TAKE 1 TABLET BY MOUTH ONCE DAILY ON AN EMPTY STOMACH WITH A GLASS OF WATER AT LEAST 30-60 MINUTES BEFORE BREAKFAST   loratadine 10 MG tablet Commonly known as: CLARITIN Take 10 mg by mouth daily.   metFORMIN 1000 MG tablet Commonly known as: GLUCOPHAGE TAKE ONE TABLET BY MOUTH TWICE DAILY WITH MEALS   ONE TOUCH ULTRA TEST test strip Generic drug: glucose blood Use 1 strip via meter twice daily as directed.   sildenafil 20 MG tablet Commonly known as: REVATIO TAKE 3 TO 5 TABLETS BY MOUTH 2 HOURS BEFORE INTERCOURSE ON EMPTY STOMACH   simvastatin 20 MG tablet Commonly known as: ZOCOR Take by mouth.   Soma 350 MG tablet Generic drug: carisoprodol Soma 350 mg tablet  Take 1 tablet 3 times a day by oral route as needed.   Tradjenta 5 MG Tabs tablet Generic drug: linagliptin TAKE 1 TABLET BY MOUTH ONCE DAILY       Allergies:  Allergies  Allergen Reactions  . Shellfish-Derived Products Anaphylaxis    Family History: Family History  Problem Relation Age of Onset  . Diabetes Mellitus II Mother   . Stroke Father   . Kidney disease Neg Hx   . Bladder Cancer Neg Hx   . Prostate cancer Neg Hx   . Kidney cancer Neg Hx     Social History:  reports that he has quit smoking. He has never used smokeless tobacco. He reports current alcohol use. He reports that he does not use  drugs.  ROS: For pertinent review of systems please refer to history of present illness  Physical Exam: BP (!) 169/90   Pulse 60   Ht 5' 10.5" (1.791 m)   Wt 246 lb (111.6 kg)   BMI 34.80 kg/m   Constitutional:  Well nourished. Alert and oriented, No acute distress. HEENT: [place] AT, mask in place.  Trachea midline Cardiovascular: No clubbing, cyanosis, or edema. Respiratory: Normal respiratory effort, no increased work of breathing. GI: Abdomen is soft, non tender, non distended, no abdominal masses. Liver and spleen not palpable.  No hernias appreciated.  Stool sample for occult testing is not indicated.   GU: No CVA tenderness.  No bladder fullness or masses.  Patient with circumcised phallus.  Foreskin easily retracted  Urethral meatus is patent.  No penile discharge. No penile lesions or rashes. Scrotum without lesions, cysts, rashes and/or edema.  Testicles are located scrotally bilaterally. No masses are appreciated in the testicles. Left and right epididymis are normal. Rectal: Patient with  normal sphincter tone. Anus and perineum without scarring or rashes. No rectal masses are appreciated. Prostate is approximately 50 grams, could only palpate the apex and midportion of the gland, no nodules are appreciated. Seminal vesicles could not be palpated  Skin: No rashes, bruises or suspicious lesions. Lymph: No inguinal adenopathy. Neurologic: Grossly intact, no focal deficits, moving all 4 extremities. Psychiatric: Normal mood and affect.  Laboratory Data: PSA Trend in HPI Urinalysis Component     Latest Ref Rng & Units 07/09/2020  Specific Gravity, UA     1.005 - 1.030 1.025  pH, UA     5.0 - 7.5 5.0  Color, UA     Yellow Yellow  Appearance Ur     Clear Clear  Leukocytes,UA     Negative Negative  Protein,UA     Negative/Trace Negative  Glucose, UA     Negative Negative  Ketones, UA     Negative Negative  RBC, UA     Negative Negative  Bilirubin, UA     Negative  Negative  Urobilinogen, Ur     0.2 - 1.0 mg/dL 0.2  Nitrite, UA     Negative Negative  Microscopic Examination      See below:   Component     Latest Ref Rng & Units 07/09/2020  WBC, UA     0 - 5 /hpf 0-5  RBC     0 - 2 /hpf 0-2  Epithelial Cells (non renal)     0 - 10 /hpf 0-10  Casts     None seen /lpf Present (A)  Cast Type     N/A Hyaline casts  Bacteria, UA     None seen/Few None seen   I have reviewed the labs.  Assessment & Plan:    1. History of elevated PSA PSA reached 4.18 in 05/2015 - started on finasteride - PSA reduced - current PSA 2.4 - corrected 4.8  (06/2020) Continue finasteride 5 mg daily RTC in 6 months for PSA - if PSA continues to trend upward will obtain a prostate MRI - if it trends downward will increase monitoring to every 6 months for the next few PSA's to ensure PSA stabilizes  2. BPH with LUTS IPSS score is 3/1, it is slightly worse Continue conservative management, avoiding bladder irritants and timed voiding's Continue finasteride 5 mg daily  RTC pending PSA results  3. Erectile dysfunction SHIM score is 17, it is worsened Continue sildenafil 20 mg, 3 to 5 tablets  RTC in 12 months for SHIM score and exam   Return in about 6 months (around 01/09/2021) for PSA only .  Michiel Cowboy, PA-C  [facility] [address] [place], [place] [address] [phone]

## 2020-11-12 ENCOUNTER — Other Ambulatory Visit (HOSPITAL_COMMUNITY): Payer: Self-pay | Admitting: Physician Assistant

## 2020-11-12 ENCOUNTER — Other Ambulatory Visit: Payer: Self-pay | Admitting: Physician Assistant

## 2020-11-12 DIAGNOSIS — R1310 Dysphagia, unspecified: Secondary | ICD-10-CM

## 2020-11-19 ENCOUNTER — Ambulatory Visit: Payer: 59

## 2020-11-27 ENCOUNTER — Other Ambulatory Visit: Payer: Self-pay

## 2020-11-27 ENCOUNTER — Ambulatory Visit
Admission: RE | Admit: 2020-11-27 | Discharge: 2020-11-27 | Disposition: A | Discharge status: Home / Self Care | Payer: 59 | Source: Ambulatory Visit | Attending: Physician Assistant | Admitting: Physician Assistant

## 2020-11-27 DIAGNOSIS — R1310 Dysphagia, unspecified: Secondary | ICD-10-CM | POA: Diagnosis present

## 2021-01-04 IMAGING — RF DG ESOPHAGUS
8 of 10 series · 14 of 19 positions shown · non-contrast
Comparison: None.

CLINICAL DATA: Dysphagia, phlegm stuck in the throat

EXAM:
ESOPHOGRAM / BARIUM SWALLOW / BARIUM TABLET STUDY
TECHNIQUE: Combined double contrast and single contrast examination performed
using effervescent crystals, thick barium liquid, and thin barium
liquid. The patient was observed with fluoroscopy swallowing a 13 mm
barium sulphate tablet.
FLUOROSCOPY TIME:  Fluoroscopy Time:  0.5 minute
Radiation Exposure Index (if provided by the fluoroscopic device): 5
mGy
Number of Acquired Spot Images: 0

[Series 1: cp_standard · 0.25mm/px · 3 of 20 frames shown (1 of 8)]
[frame 2/20]
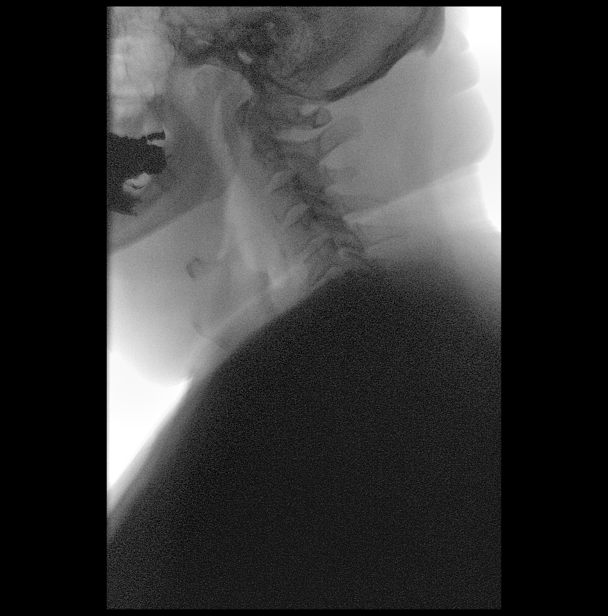
[frame 11/20]
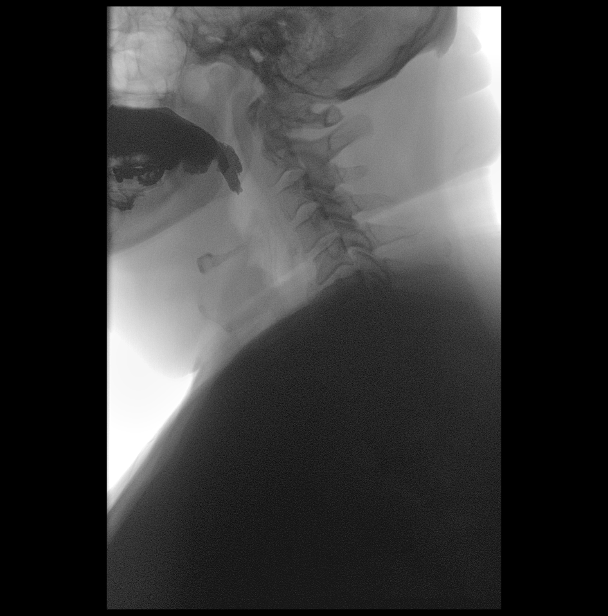
[frame 18/20]
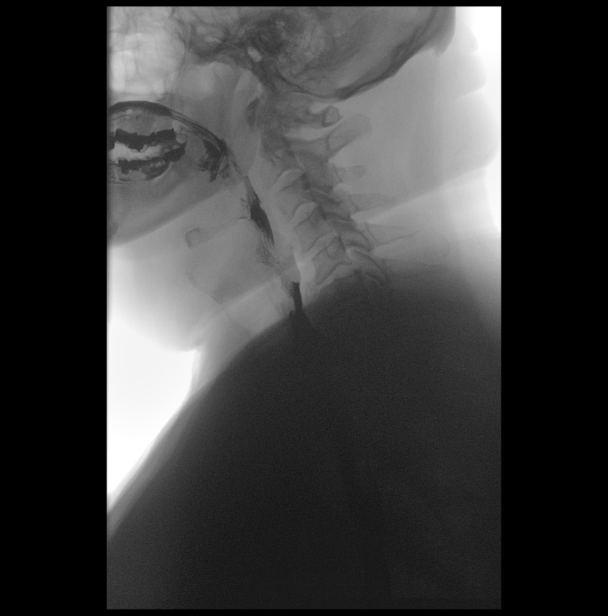

[Series 2: cp_standard · 0.25mm/px · 3 of 10 frames shown (2 of 8)]
[frame 1/10]
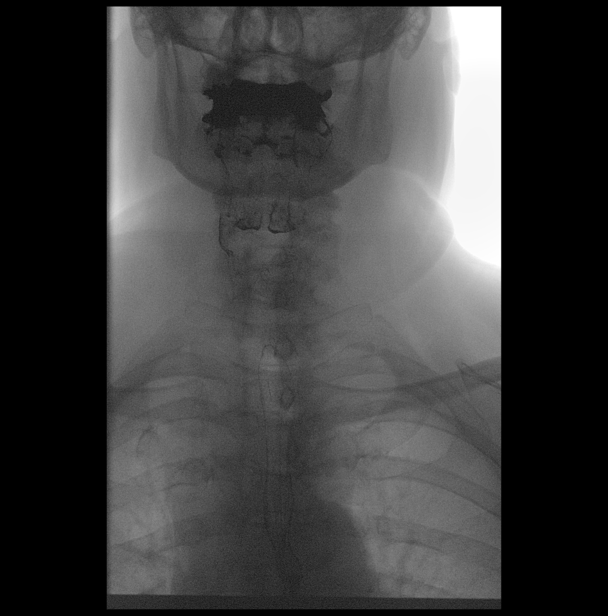
[frame 6/10]
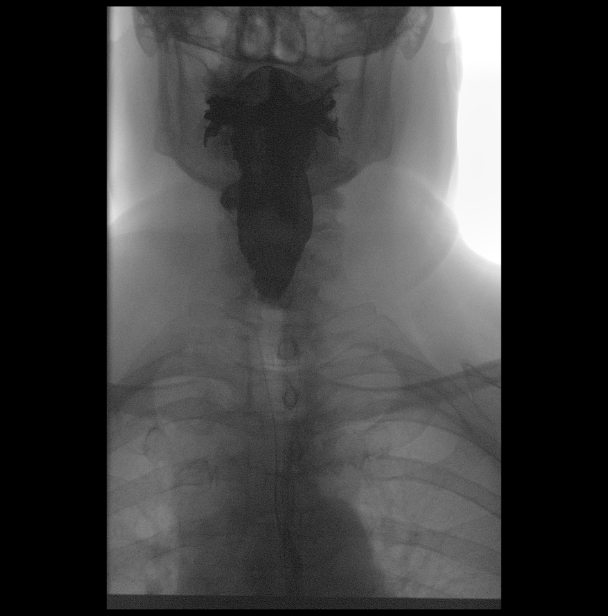
[frame 9/10]
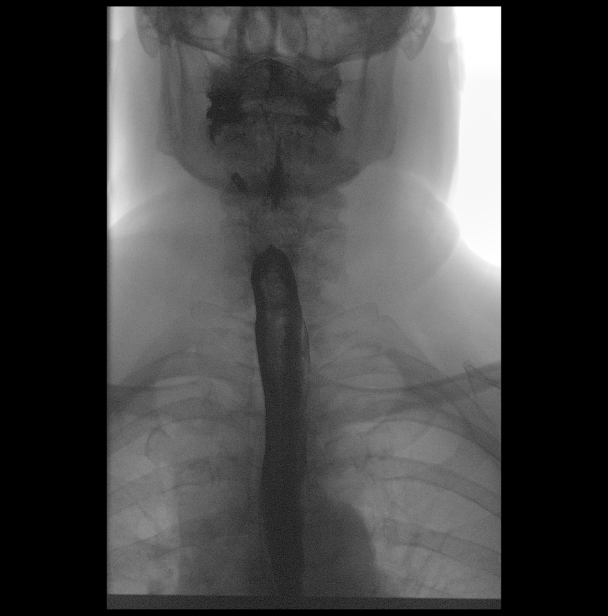

[Series 3: cp_standard · 0.25mm/px · 1 of 1 slices shown (3 of 8)]
[im 1/1]
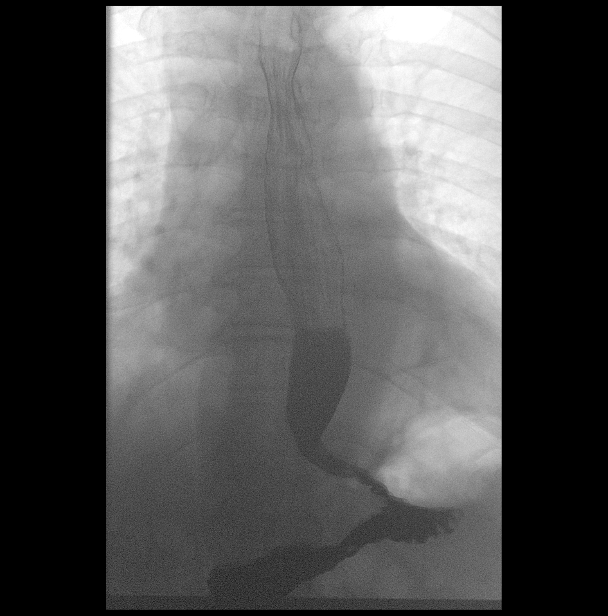

[Series 4: cp_standard · 0.25mm/px · 3 of 43 frames shown (4 of 8)]
[frame 7/43]
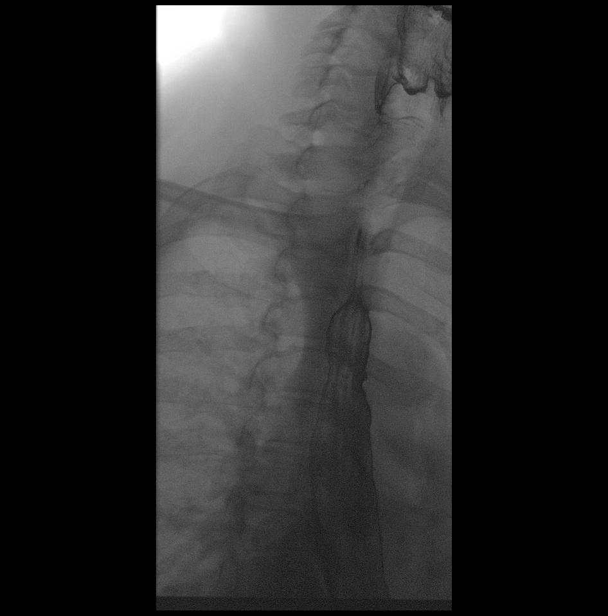
[frame 22/43]
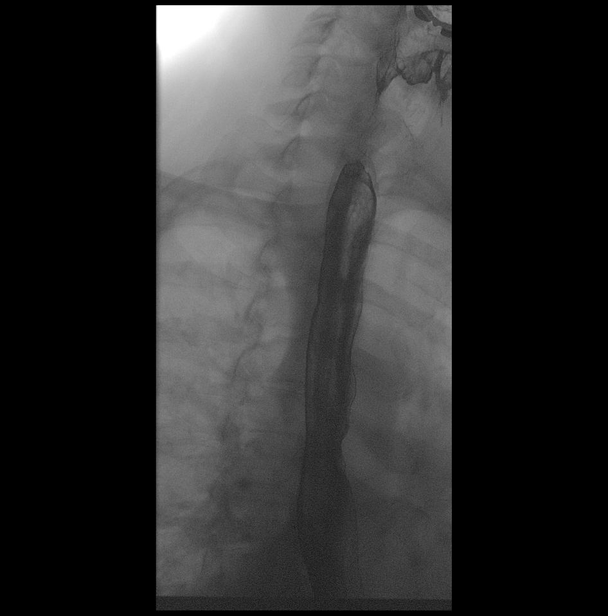
[frame 37/43]
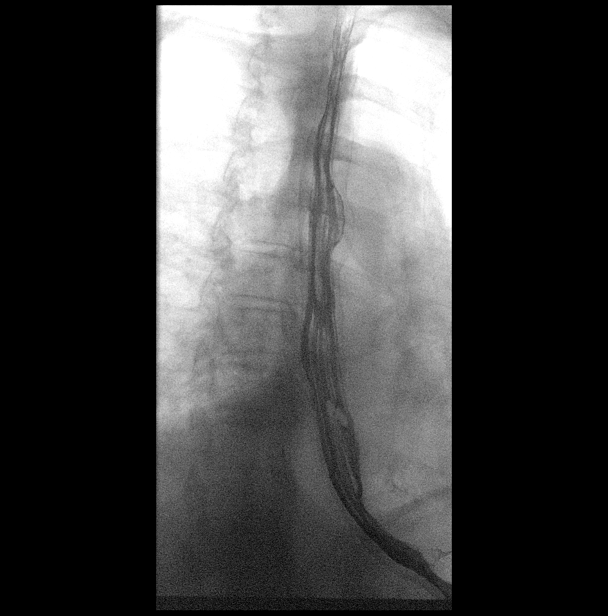

[Series 6: cp_standard · 0.25mm/px · 1 of 1 slices shown (5 of 8)]
[im 1/1]
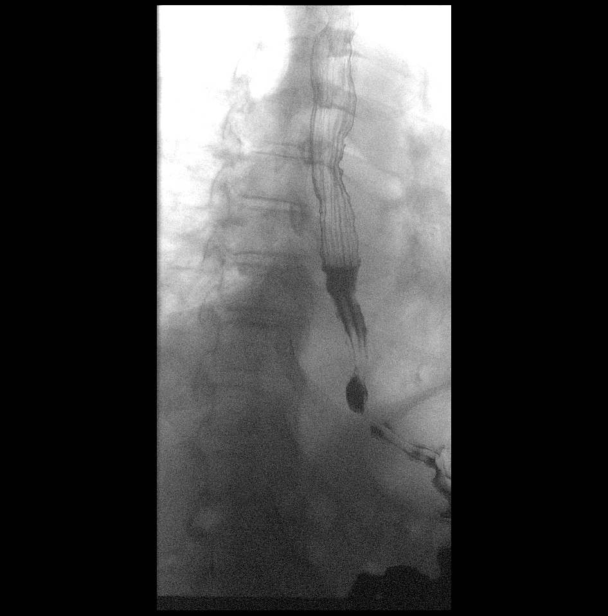

[Series 7: cp_standard · 0.27mm/px · 1 of 1 slices shown (6 of 8)]
[im 1/1]
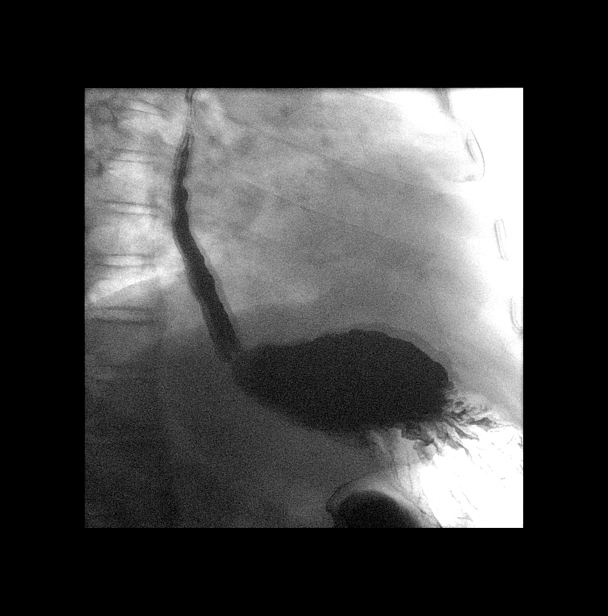

[Series 8: cp_standard · 0.27mm/px · 1 of 1 slices shown (7 of 8)]
[im 1/1]
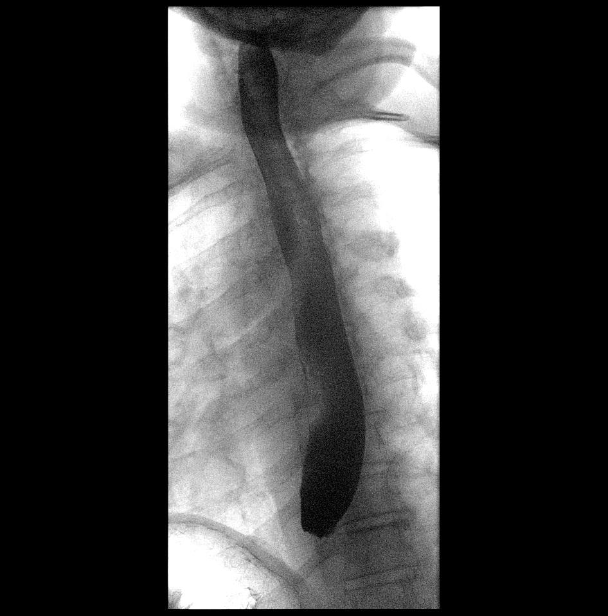

[Series 10: cp_standard · 0.28mm/px · 1 of 1 slices shown (8 of 8)]
[im 1/1]
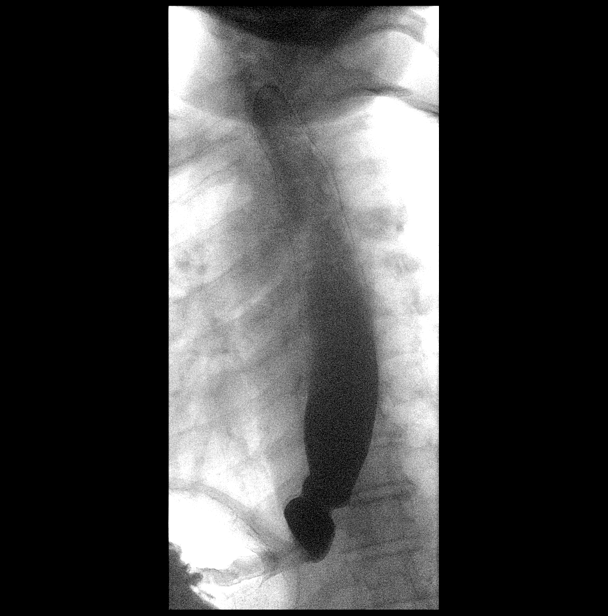

[14 of 19 positions shown; findings below may reference images not displayed]

FINDINGS: Normal pharyngeal anatomy and motility. Contrast flowed freely
through the esophagus without evidence of a stricture or mass.
Normal esophageal mucosa without evidence of irregularity or
ulceration. Mild tertiary contractions of the esophagus as can be
seen with spasm. Moderate gastroesophageal reflux. No definite
hiatal hernia was demonstrated.

At the end of the examination a 13 mm barium tablet was administered
which transited through the esophagus and esophagogastric junction
without delay.
IMPRESSION: Mild tertiary contractions of the esophagus as can be seen with
spasm.

Moderate gastroesophageal reflux.

## 2021-01-09 ENCOUNTER — Other Ambulatory Visit: Payer: Self-pay

## 2021-01-22 ENCOUNTER — Other Ambulatory Visit: Payer: Self-pay | Admitting: *Deleted

## 2021-01-22 DIAGNOSIS — Z87898 Personal history of other specified conditions: Secondary | ICD-10-CM

## 2021-01-23 ENCOUNTER — Other Ambulatory Visit: Payer: Self-pay

## 2021-01-23 ENCOUNTER — Other Ambulatory Visit: Payer: 59

## 2021-01-23 DIAGNOSIS — Z87898 Personal history of other specified conditions: Secondary | ICD-10-CM

## 2021-01-24 LAB — PSA: Prostate Specific Ag, Serum: 2.2 ng/mL (ref 0.0–4.0)

## 2021-02-04 ENCOUNTER — Telehealth: Payer: Self-pay | Admitting: Family Medicine

## 2021-02-04 NOTE — Telephone Encounter (Signed)
LMOM for patient to return call.

## 2021-02-04 NOTE — Telephone Encounter (Signed)
-----   Message from Harle Battiest, PA-C sent at 02/04/2021 12:53 PM EST ----- Please let Mr. Krenn know that his PSA has trended downward from 2.4-2.2.  I would like to see him again in 6 months for repeat PSA, I PSS, SHIM and exam.

## 2022-06-03 ENCOUNTER — Telehealth: Payer: Self-pay | Admitting: Urology

## 2022-06-03 NOTE — Telephone Encounter (Signed)
Benjamin Potter had his PSA repeated by his PCP in May and it has trended up.  This should not occur while he is on finasteride and I would like to see him for further evaluation and discussion.  We need to make sure he is not at risk for prostate cancer.

## 2022-06-05 NOTE — Telephone Encounter (Signed)
Notified pt of message, scheduled pt for appt on 06/16/22, pt confirmed.

## 2022-06-16 ENCOUNTER — Ambulatory Visit: Payer: 59 | Admitting: Urology

## 2022-07-09 NOTE — Progress Notes (Signed)
9:28 AM   Benjamin Potter [birth year] [id number]  Referring provider: Alanson Aly, FNP No address on file  Urological history: 1. Elevated PSA PSA Trend  4.18 in June 2016  3.8 in August 2016  3.8 in December 2016  4.2 in March 2017  4.9 in June 2017  2.0 (4.0) in September 2017 - on finasteride  1.6 (3.2) in December 2017  1.5 (3.0) in June 2018  1.4 (2.8) in June 2019  1.3 (2.6) in December 2019  1.7 (3.4) in July 2020  2.4 (4.8) in July 2021  3.16 (6.32), 06/2022  2. BPH with LU TS - I PSS 2/1 - finasteride 5 mg daily   3. ED -contributing factors of age, BPH, diabetes, HTN, hypothyroidism, CKD, HLD, lumbar spinal stenosis, alcohol consumption and former smoker -SHIM 13 -sildenafil 20 mg, on-demand-dosing  Chief Complaint  Patient presents with   Elevated PSA   Erectile Dysfunction   Benign Prostatic Hypertrophy    HPI: Benjamin Potter is a 66 y.o. male who presents today for office visit.  He was being seen every 6 months, but has missed the last few appointments.  He has no urinary complaints at this time.  Patient denies any modifying or aggravating factors.  Patient denies any gross hematuria, dysuria or suprapubic/flank pain.  Patient denies any fevers, chills, nausea or vomiting.     IPSS     Row Name 07/10/22 0900         International Prostate Symptom Score   How often have you had the sensation of not emptying your bladder? Not at All     How often have you had to urinate less than every two hours? Less than 1 in 5 times     How often have you found you stopped and started again several times when you urinated? Not at All     How often have you found it difficult to postpone urination? Not at All     How often have you had a weak urinary stream? Not at All     How often have you had to strain to start urination? Not at All     How many times did you typically get up at night to urinate? 1 Time     Total IPSS Score 2       Quality of Life  due to urinary symptoms   If you were to spend the rest of your life with your urinary condition just the way it is now how would you feel about that? Pleased               Score:  1-7 Mild 8-19 Moderate 20-35 Severe  Patient is not having spontaneous erections.  He denies any pain or curvature with erections.   Sildenafil is working well for him    [place] Name 07/10/22 0901         SHIM: Over the last 6 months:   How do you rate your confidence that you could get and keep an erection? Very Low     When you had erections with sexual stimulation, how often were your erections hard enough for penetration (entering your partner)? Almost Never or Never     During sexual intercourse, how often were you able to maintain your erection after you had penetrated (entered) your partner? Sometimes (about half the time)     During sexual intercourse, how difficult was it to maintain your erection to completion  of intercourse? Slightly Difficult     When you attempted sexual intercourse, how often was it satisfactory for you? Most Times (much more than half the time)       SHIM Total Score   SHIM 13               Score: 1-7 Severe ED 8-11 Moderate ED 12-16 Mild-Moderate ED 17-21 Mild ED 22-25 No ED   PMH: Past Medical History:  Diagnosis Date   Acid reflux    Diabetes mellitus, type II (HCC)    Gout    HLD (hyperlipidemia)    HTN (hypertension)    Hypothyroidism     Surgical History: Past Surgical History:  Procedure Laterality Date   TONSILLECTOMY      Home Medications:  Allergies as of 07/10/2022       Reactions   Shellfish-derived Products Anaphylaxis        Medication List        Accurate as of July 10, 2022  9:28 AM. If you have any questions, ask your nurse or doctor.          allopurinol 100 MG tablet Commonly known as: ZYLOPRIM Take 100 mg by mouth daily.   amLODipine 10 MG tablet Commonly known as: NORVASC TAKE ONE TABLET BY  MOUTH ONCE DAILY   atenolol 50 MG tablet Commonly known as: TENORMIN TAKE ONE TABLET BY MOUTH ONCE DAILY   benazepril 20 MG tablet Commonly known as: LOTENSIN Take by mouth.   colchicine 0.6 MG tablet Take 2 tablets (1.36m) by mouth at first sign of gout flare followed by 1 tablet (0.680m after 1 hour. (Max 1.62m65mithin 1 hour)   diclofenac 75 MG EC tablet Commonly known as: VOLTAREN diclofenac sodium 75 mg tablet,delayed release  Take 1 tablet twice a day by oral route after meals for 10 days.   finasteride 5 MG tablet Commonly known as: PROSCAR Take 1 tablet (5 mg total) by mouth daily.   fluticasone 50 MCG/ACT nasal spray Commonly known as: FLONASE Place into the nose.   levothyroxine 125 MCG tablet Commonly known as: SYNTHROID TAKE 1 TABLET BY MOUTH ONCE DAILY ON AN EMPTY STOMACH WITH A GLASS OF WATER AT LEAST 30-60 MINUTES BEFORE BREAKFAST   loratadine 10 MG tablet Commonly known as: CLARITIN Take 10 mg by mouth daily.   metFORMIN 1000 MG tablet Commonly known as: GLUCOPHAGE TAKE ONE TABLET BY MOUTH TWICE DAILY WITH MEALS   ONE TOUCH ULTRA TEST test strip Generic drug: glucose blood Use 1 strip via meter twice daily as directed.   sildenafil 20 MG tablet Commonly known as: REVATIO TAKE 3 TO 5 TABLETS BY MOUTH 2 HOURS BEFORE INTERCOURSE ON EMPTY STOMACH   simvastatin 20 MG tablet Commonly known as: ZOCOR Take by mouth.   Soma 350 MG tablet Generic drug: carisoprodol Soma 350 mg tablet  Take 1 tablet 3 times a day by oral route as needed.   Tradjenta 5 MG Tabs tablet Generic drug: linagliptin TAKE 1 TABLET BY MOUTH ONCE DAILY        Allergies:  Allergies  Allergen Reactions   Shellfish-Derived Products Anaphylaxis    Family History: Family History  Problem Relation Age of Onset   Diabetes Mellitus II Mother    Stroke Father    Kidney disease Neg Hx    Bladder Cancer Neg Hx    Prostate cancer Neg Hx    Kidney cancer Neg Hx     Social  History:  reports  that he has quit smoking. He has never used smokeless tobacco. He reports current alcohol use. He reports that he does not use drugs.  ROS: For pertinent review of systems please refer to history of present illness  Physical Exam: BP (!) 153/82   Pulse 60   Ht '5\' 10"'  (1.778 m)   Wt 251 lb (113.9 kg)   BMI 36.01 kg/m   Constitutional:  Well nourished. Alert and oriented, No acute distress. HEENT: [place] AT, moist mucus membranes.  Trachea midline Cardiovascular: No clubbing, cyanosis, or edema. Respiratory: Normal respiratory effort, no increased work of breathing. GU: No CVA tenderness.  No bladder fullness or masses.  Patient with uncircumcised phallus.  Foreskin easily retracted  Urethral meatus is patent.  No penile discharge. No penile lesions or rashes. Scrotum without lesions, cysts, rashes and/or edema.  Testicles are located scrotally bilaterally. No masses are appreciated in the testicles. Left and right epididymis are normal. Rectal: Patient with  normal sphincter tone. Anus and perineum without scarring or rashes. No rectal masses are appreciated. Prostate is approximately 45 grams, no nodules are appreciated. Seminal vesicles could not be palpated Neurologic: Grossly intact, no focal deficits, moving all 4 extremities. Psychiatric: Normal mood and affect.   Laboratory Data: PSA trend under urological history  WBC (White Blood Cell Count) 4.1 - 10.2 10^3/uL 11.1 High    RBC (Red Blood Cell Count) 4.69 - 6.13 10^6/uL 4.70   Hemoglobin 14.1 - 18.1 gm/dL 13.4 Low    Hematocrit 40.0 - 52.0 % 41.3   MCV (Mean Corpuscular Volume) 80.0 - 100.0 fl 87.9   MCH (Mean Corpuscular Hemoglobin) 27.0 - 31.2 pg 28.5   MCHC (Mean Corpuscular Hemoglobin Concentration) 32.0 - 36.0 gm/dL 32.4   Platelet Count 150 - 450 10^3/uL 255   RDW-CV (Red Cell Distribution Width) 11.6 - 14.8 % 14.5   MPV (Mean Platelet Volume) 9.4 - 12.4 fl 10.7   Neutrophils 1.50 - 7.80 10^3/uL 8.00 High     Lymphocytes 1.00 - 3.60 10^3/uL 1.80   Mixed Count 0.10 - 0.90 10^3/uL 1.30 High    Neutrophil % 32.0 - 70.0 % 72.6 High    Lymphocyte % 10.0 - 50.0 % 15.8   Mixed % 3.0 - 14.4 % 11.6   Resulting Agency  [facility] - [facility]   Specimen Collected: 06/25/22 11:01   Performed by: [facility] - [facility] Last Resulted: 06/25/22 11:35  Received From: [facility]  Result Received: 07/09/22 09:50   Glucose 70 - 110 mg/dL 150 High    Sodium 136 - 145 mmol/L 141   Potassium 3.6 - 5.1 mmol/L 5.0   Chloride 97 - 109 mmol/L 105   Carbon Dioxide (CO2) 22.0 - 32.0 mmol/L 25.1   Urea Nitrogen (BUN) 7 - 25 mg/dL 31 High    Creatinine 0.7 - 1.3 mg/dL 1.7 High    Glomerular Filtration Rate (eGFR), MDRD Estimate >60 mL/min/1.73sq m 41 Low    Calcium 8.7 - 10.3 mg/dL 9.7   AST  8 - 39 U/L 55 High    ALT  6 - 57 U/L 81 High    Alk Phos (alkaline Phosphatase) 34 - 104 U/L 45   Albumin 3.5 - 4.8 g/dL 4.6   Bilirubin, Total 0.3 - 1.2 mg/dL 0.9   Protein, Total 6.1 - 7.9 g/dL 7.4   A/G Ratio 1.0 - 5.0 gm/dL 1.6   Resulting Agency  [facility] - LAB   Specimen Collected: 06/25/22  11:01   Performed by: [facility] - [facility] Last Resulted: 06/25/22 17:11  Received From: [facility]  Result Received: 07/09/22 09:50   Hemoglobin A1C 4.2 - 5.6 % 7.4 High    Average Blood Glucose (Calc) mg/dL [facility] - LAB  Narrative Performed by [facility] - LAB Normal Range:    4.2 - 5.6%  Increased Risk:  5.7 - 6.4%  Diabetes:        >= 6.5%  Glycemic Control for adults with diabetes:  <7%    Specimen Collected: 06/25/22 11:01   Performed by: [facility]: 06/25/22 16:38  Received From: [facility]  Result Received: 07/09/22 09:50   Cholesterol, Total 100 - 200 mg/dL 170   Triglyceride 35 - 199 mg/dL 223 High    HDL (High Density Lipoprotein) Cholesterol  29.0 - 71.0 mg/dL 42.9   LDL Calculated 0 - 130 mg/dL 83   VLDL Cholesterol mg/dL 45   Cholesterol/HDL Ratio  4.0   Resulting Agency  [facility] - LAB   Specimen Collected: 06/25/22 11:01   Performed by: [facility]: 06/25/22 17:07  Received From: [facility]  Result Received: 07/09/22 09:50   Thyroid Stimulating Hormone (TSH) 0.450-5.330 uIU/ml uIU/mL 4.601   Resulting Agency  [facility] - LAB   Specimen Collected: 06/25/22 11:01   Performed by: [facility]: 06/25/22 17:09  Received From: [facility]  Result Received: 07/09/22 09:50   PSA (Prostate Specific Antigen), Total 0.10 - 4.00 ng/mL 3.16   [facility] - LAB  Narrative Performed by [facility] - LAB Test results were determined with Beck[employer] Coulter Hybritech Assay. Values obtained with different assay methods cannot be used interchangeably in serial testing. Assay results should not be interpreted as absolute evidence of the presence or absence of malignant disease  Specimen Collected: 06/25/22 11:01   Performed by: [facility]: 06/25/22 17:08  Received From: [facility]  Result Received: 07/09/22 09:50  I have reviewed the labs.  Assessment & Plan:    1. Elevated PSA -PSA is now at 6.32 (corrected value) -Explained that while on finasteride the PSA should not increase but rather decreased by 50% of its normal value and that this is concerning for a possible prostate cancer -We reviewed the implications of an elevated PSA and the uncertainty surrounding it. In general, a man's PSA increases with age and is produced by both normal and cancerous prostate tissue. The differential diagnosis for elevated PSA includes BPH, prostate cancer, infection, recent intercourse/ejaculation, recent urethroscopic manipulation (foley  placement/cystoscopy) or trauma, and prostatitis.  -Management of an elevated PSA can include observation, prostate MRI or prostate biopsy and we discussed this in detail. Our goal is to detect clinically significant prostate cancers, and manage with either active surveillance, surgery, or radiation for localized disease. Risks of prostate biopsy include bleeding, infection (including life threatening sepsis), pain, and lower urinary symptoms. Hematuria, hematospermia, and blood in the stool are all common after biopsy and can persist up to 4 weeks.   -He would like to repeat the PSA in 3 months and I cautioned him that this could possibly delay a prostate cancer diagnosis and he voiced his understanding  2. BPH with LUTS -DRE benign -continue conservative management, avoiding bladder  irritants and timed voiding's -Continue finasteride 5 mg daily   3. Erectile dysfunction -Continue sildenafil 20 mg, on-demand dosing  Return in about 3 months (around 10/10/2022) for free and total PSA .  Zara Council, PA-C  [facility] [address] [place], [place] [address] [phone]

## 2022-07-10 ENCOUNTER — Other Ambulatory Visit: Payer: Self-pay

## 2022-07-10 ENCOUNTER — Ambulatory Visit: Payer: 59 | Admitting: Urology

## 2022-07-10 ENCOUNTER — Encounter: Payer: Self-pay | Admitting: Urology

## 2022-07-10 VITALS — BP 153/82 | HR 60 | Ht 70.0 in | Wt 251.0 lb

## 2022-07-10 DIAGNOSIS — N529 Male erectile dysfunction, unspecified: Secondary | ICD-10-CM

## 2022-07-10 DIAGNOSIS — N401 Enlarged prostate with lower urinary tract symptoms: Secondary | ICD-10-CM

## 2022-07-10 DIAGNOSIS — Z87898 Personal history of other specified conditions: Secondary | ICD-10-CM

## 2022-07-10 DIAGNOSIS — N138 Other obstructive and reflux uropathy: Secondary | ICD-10-CM

## 2022-07-10 DIAGNOSIS — R972 Elevated prostate specific antigen [PSA]: Secondary | ICD-10-CM

## 2022-07-10 MED ORDER — FINASTERIDE 5 MG PO TABS: 5.0000 mg | ORAL_TABLET | Freq: Every day | ORAL | 3 refills | Status: DC

## 2022-10-09 ENCOUNTER — Other Ambulatory Visit: Payer: 59

## 2022-10-09 DIAGNOSIS — R972 Elevated prostate specific antigen [PSA]: Secondary | ICD-10-CM

## 2022-10-10 LAB — PSA TOTAL (REFLEX TO FREE): Prostate Specific Ag, Serum: 3.3 ng/mL (ref 0.0–4.0)

## 2022-10-22 NOTE — Telephone Encounter (Signed)
Would you call him and let him know about the MyChart message I sent him?  Benjamin Potter, Your PSA continues to increase.  As we discussed at your office visit in July, your PSA should not increase while on finasteride.  I recommend that we either proceed with a prostate biopsy to rule out prostate cancer or a prostate MRI for further evaluation at this time.  Please let me know how you want to proceed. Huntleigh Doolen, PA-C

## 2022-10-26 ENCOUNTER — Telehealth: Payer: Self-pay | Admitting: Urology

## 2022-10-26 NOTE — Telephone Encounter (Signed)
Where we able to get him by telephone? And if so, did he want to proceed with the prostate MRI or the prostate biopsy?

## 2022-10-26 NOTE — Telephone Encounter (Signed)
Per a mychart message he wants a prostate biopsy.

## 2022-10-27 ENCOUNTER — Encounter: Payer: Self-pay | Admitting: Family Medicine

## 2022-10-27 NOTE — Telephone Encounter (Signed)
He does take a baby aspirin everyday prescribed by Lawson Radar, NP . A Cardiac clearance has been faxed to the office to stop ASA 5 days prior

## 2022-10-27 NOTE — Telephone Encounter (Signed)
Spoke to patient and he has not taken Colcrys in awhile, I informed him that he will not be able to take NSAIDS 7 days prior to the Biopsy. He has not been scheduled yet.

## 2022-10-30 NOTE — Telephone Encounter (Signed)
Pt scheduled for biopsy and results appts. Pt given biopsy instructions over the phone and sent via Mychart.

## 2022-10-30 NOTE — Telephone Encounter (Signed)
Cardiac clearance received. Ok for pt to stop ASA prior to procedure as appropriate.

## 2022-11-19 ENCOUNTER — Other Ambulatory Visit: Payer: 59 | Admitting: Urology

## 2022-11-24 ENCOUNTER — Encounter: Payer: Self-pay | Admitting: Urology

## 2022-11-24 ENCOUNTER — Ambulatory Visit (INDEPENDENT_AMBULATORY_CARE_PROVIDER_SITE_OTHER): Payer: 59 | Admitting: Urology

## 2022-11-24 DIAGNOSIS — N401 Enlarged prostate with lower urinary tract symptoms: Secondary | ICD-10-CM | POA: Diagnosis not present

## 2022-11-24 DIAGNOSIS — R972 Elevated prostate specific antigen [PSA]: Secondary | ICD-10-CM | POA: Diagnosis not present

## 2022-11-24 DIAGNOSIS — N138 Other obstructive and reflux uropathy: Secondary | ICD-10-CM | POA: Diagnosis not present

## 2022-11-24 MED ORDER — CEFTRIAXONE SODIUM 1 G IJ SOLR
1.0000 g | Freq: Once | INTRAMUSCULAR | Status: AC
  Administered 2022-11-24: 1 g via INTRAMUSCULAR

## 2022-11-24 MED ORDER — LEVOFLOXACIN 500 MG PO TABS
500.0000 mg | ORAL_TABLET | Freq: Once | ORAL | Status: AC
  Administered 2022-11-24: 500 mg via ORAL

## 2022-11-24 NOTE — Progress Notes (Signed)
   11/24/22  CC:  Chief Complaint  Patient presents with   Prostate Biopsy    HPI: 66 yo M with rising PSA who presents today for prostate biopsy.  NED. A&Ox3.   No respiratory distress   Abd soft, NT, ND Normal sphincter tone  Prostate Biopsy Procedure   Informed consent was obtained after discussing risks/benefits of the procedure.  A time out was performed to ensure correct patient identity.  Pre-Procedure: - Ceftriaxone given prophylactically - Levaquin 500 mg administered PO -Transrectal Ultrasound performed revealing a 64 gm prostate -No significant hypoechoic or median lobe noted  Procedure: - Prostate block performed using 10 cc 1% lidocaine and biopsies taken from sextant areas, a total of 12 under ultrasound guidance.  Post-Procedure: - Patient tolerated the procedure well - He was counseled to seek immediate medical attention if experiences any severe pain, significant bleeding, or fevers - Return in one week to discuss biopsy results    Vanna Scotland, MD

## 2022-11-27 LAB — SURGICAL PATHOLOGY

## 2022-12-01 ENCOUNTER — Telehealth: Payer: Self-pay | Admitting: *Deleted

## 2022-12-01 DIAGNOSIS — R972 Elevated prostate specific antigen [PSA]: Secondary | ICD-10-CM

## 2022-12-01 NOTE — Telephone Encounter (Signed)
-----   Message from Vanna Scotland, MD sent at 12/01/2022 10:33 AM EST ----- Prostate biopsy was negative, there is no evidence of cancer on biopsy which is awesome news.  I would recommend that he follow-up with Carollee Herter in 6 months.  He should have a PSA prior to that.  Please cancel follow-up results appointment.  Vanna Scotland, MD

## 2022-12-01 NOTE — Telephone Encounter (Signed)
.  left message to have patient return my call.  

## 2022-12-04 ENCOUNTER — Ambulatory Visit: Payer: 59 | Admitting: Urology

## 2022-12-04 NOTE — Telephone Encounter (Signed)
Spoke with patient and advised results Appts scheduled 

## 2022-12-08 ENCOUNTER — Ambulatory Visit: Payer: 59 | Admitting: Urology

## 2023-05-31 ENCOUNTER — Other Ambulatory Visit: Payer: 59

## 2023-05-31 DIAGNOSIS — R972 Elevated prostate specific antigen [PSA]: Secondary | ICD-10-CM

## 2023-06-01 LAB — PSA: Prostate Specific Ag, Serum: 3.7 ng/mL (ref 0.0–4.0)

## 2023-06-03 ENCOUNTER — Ambulatory Visit: Payer: 59 | Admitting: Urology

## 2023-07-21 NOTE — Progress Notes (Signed)
07/22/2023 4:36 PM   Benjamin Potter 20-Apr-1956 [id number]  Referring provider: Cheryll Dessert, FNP No address on file  Urological history: 1.  Elevated PSA -PSA 4.9 in 2017 - started on finasteride  -negative biopsy (11/2022) - PSA 3.3  2. BPH with LU TS -PSA (05/2023) 3.7 -prostate volume (2023) 64 grams -Finasteride 5 mg daily  3. ED -Contributing factors of age, BPH, diabetes, hypertension, hypothyroidism, CKD, HLD, lumbar spinal stenosis, alcohol consumption and former smoker -Sildenafil 20 mg, on-demand dosing  Chief Complaint  Patient presents with   Follow-up   Prostate Biopsy    HPI: Benjamin Potter is a 67 y.o. male who presents today for 6 month follow up.   Previous records reviewed.   I PSS 4/1  No urinary complaints at this time.   Patient denies any modifying or aggravating factors.  Patient denies any recent UTI's, gross hematuria, dysuria or suprapubic/flank pain.  Patient denies any fevers, chills, nausea or vomiting.    IPSS     Row Name 07/22/23 1500         International Prostate Symptom Score   How often have you had the sensation of not emptying your bladder? Not at All     How often have you had to urinate less than every two hours? Less than 1 in 5 times     How often have you found you stopped and started again several times when you urinated? Less than half the time     How often have you found it difficult to postpone urination? Not at All     How often have you had a weak urinary stream? Not at All     How often have you had to strain to start urination? Not at All     How many times did you typically get up at night to urinate? 1 Time     Total IPSS Score 4       Quality of Life due to urinary symptoms   If you were to spend the rest of your life with your urinary condition just the way it is now how would you feel about that? Pleased              Score:  1-7 Mild 8-19 Moderate 20-35 Severe    SHIM 5  Patient is  not having spontaneous erections.  He denies any pain or curvature with erections.   He is only taking three tablets prior to intercourse.  He is not having adequate erections for intercourse.    SHIM     Row Name 07/22/23 1540         SHIM: Over the last 6 months:   How do you rate your confidence that you could get and keep an erection? Very Low     When you had erections with sexual stimulation, how often were your erections hard enough for penetration (entering your partner)? Almost Never or Never     During sexual intercourse, how often were you able to maintain your erection after you had penetrated (entered) your partner? Almost Never or Never     During sexual intercourse, how difficult was it to maintain your erection to completion of intercourse? Extremely Difficult     When you attempted sexual intercourse, how often was it satisfactory for you? Almost Never or Never       SHIM Total Score   SHIM 5  Score: 1-7 Severe ED 8-11 Moderate ED 12-16 Mild-Moderate ED 17-21 Mild ED 22-25 No ED   PMH: Past Medical History:  Diagnosis Date   Acid reflux    Diabetes mellitus, type II (HCC)    Gout    HLD (hyperlipidemia)    HTN (hypertension)    Hypothyroidism     Surgical History: Past Surgical History:  Procedure Laterality Date   TONSILLECTOMY      Home Medications:  Allergies as of 07/22/2023       Reactions   Shellfish-derived Products Anaphylaxis        Medication List        Accurate as of July 22, 2023 11:59 PM. If you have any questions, ask your nurse or doctor.          allopurinol 100 MG tablet Commonly known as: ZYLOPRIM Take 100 mg by mouth daily.   amLODipine 10 MG tablet Commonly known as: NORVASC TAKE ONE TABLET BY MOUTH ONCE DAILY   aspirin EC 81 MG tablet Take 81 mg by mouth daily. Swallow whole.   atenolol 25 MG tablet Commonly known as: TENORMIN Take 25 mg by mouth daily.    benazepril-hydrochlorthiazide 20-12.5 MG tablet Commonly known as: LOTENSIN HCT Take 1 tablet by mouth daily.   finasteride 5 MG tablet Commonly known as: PROSCAR Take 1 tablet (5 mg total) by mouth daily.   levothyroxine 125 MCG tablet Commonly known as: SYNTHROID TAKE 1 TABLET BY MOUTH ONCE DAILY ON AN EMPTY STOMACH WITH A GLASS OF WATER AT LEAST 30-60 MINUTES BEFORE BREAKFAST   metFORMIN 1000 MG tablet Commonly known as: GLUCOPHAGE TAKE ONE TABLET BY MOUTH TWICE DAILY WITH MEALS   ONE TOUCH ULTRA TEST test strip Generic drug: glucose blood Use 1 strip via meter twice daily as directed.   sildenafil 20 MG tablet Commonly known as: REVATIO TAKE ONE TABLET DAILY.  TAKE 3 TO 5 TABLETS BY MOUTH 2 HOURS BEFORE INTERCOURSE ON EMPTY STOMACH What changed: additional instructions Changed by:     simvastatin 20 MG tablet Commonly known as: ZOCOR Take by mouth.   Tradjenta 5 MG Tabs tablet Generic drug: linagliptin TAKE 1 TABLET BY MOUTH ONCE DAILY        Allergies:  Allergies  Allergen Reactions   Shellfish-Derived Products Anaphylaxis    Family History: Family History  Problem Relation Age of Onset   Diabetes Mellitus II Mother    Stroke Father    Kidney disease Neg Hx    Bladder Cancer Neg Hx    Prostate cancer Neg Hx    Kidney cancer Neg Hx     Social History:  reports that he has quit smoking. He has been exposed to tobacco smoke. He has never used smokeless tobacco. He reports current alcohol use. He reports that he does not use drugs.  ROS: Pertinent ROS in HPI  Physical Exam: BP 111/80   Pulse 86   Wt 249 lb 6.4 oz (113.1 kg)   BMI 35.79 kg/m   Constitutional:  Well nourished. Alert and oriented, No acute distress. HEENT: [place] AT, moist mucus membranes.  Trachea midline Cardiovascular: No clubbing, cyanosis, or edema. Respiratory: Normal respiratory effort, no increased work of breathing. GU: No CVA tenderness.  No bladder fullness or  masses.  Patient with uncircumcised phallus.  Foreskin easily retracted  Urethral meatus is patent.  No penile discharge. No penile lesions or rashes. Scrotum without lesions, cysts, rashes and/or edema.  Testicles are located scrotally bilaterally. No masses are  appreciated in the testicles. Left and right epididymis are normal. Rectal: Patient with  normal sphincter tone. Anus and perineum without scarring or rashes. No rectal masses are appreciated. Prostate is approximately 45 grams, could only palpate the apex and midportion of the gland, no nodules are appreciated. Seminal vesicles could not be palpated.  Skin: No rashes, bruises or suspicious lesions. Lymph: No cervical or inguinal adenopathy. Neurologic: Grossly intact, no focal deficits, moving all 4 extremities. Psychiatric: Normal mood and affect.  Laboratory Data: Results for orders placed or performed in visit on 05/31/23  PSA  Result Value Ref Range   Prostate Specific Ag, Serum 3.7 0.0 - 4.0 ng/mL    Comprehensive Metabolic Panel (CMP) Order: [id number] Component Ref Range & Units 6 mo ago Glucose 70 - 110 mg/dL 086 High  Sodium 578 - 145 mmol/L 140 Potassium 3.6 - 5.1 mmol/L 5.4 High  Chloride 97 - 109 mmol/L 104 Carbon Dioxide (CO2) 22.0 - 32.0 mmol/L 30.6 Urea Nitrogen (BUN) 7 - 25 mg/dL 30 High  Creatinine 0.7 - 1.3 mg/dL 1.7 High  Glomerular Filtration Rate (eGFR) >60 mL/min/1.73sq m 44 Low  Comment: CKD-EPI (2021) does not include patient's race in the calculation of eGFR.  Monitoring changes of plasma creatinine and eGFR over time is useful for monitoring kidney function.  Interpretive Ranges for eGFR (CKD-EPI 2021):  eGFR:       >60 mL/min/1.73 sq. m - Normal eGFR:       30-59 mL/min/1.73 sq. m - Moderately Decreased eGFR:       15-29 mL/min/1.73 sq. m  - Severely Decreased eGFR:       < 15 mL/min/1.73 sq. m  - Kidney Failure   Note: These eGFR calculations do not apply in acute situations  when eGFR is changing rapidly or patients on dialysis. Calcium 8.7 - 10.3 mg/dL 9.8 AST 8 - 39 U/L 35 ALT 6 - 57 U/L 62 High  Alk Phos (alkaline Phosphatase) 34 - 104 U/L 44 Albumin 3.5 - 4.8 g/dL 4.5 Bilirubin, Total 0.3 - 1.2 mg/dL 0.8 Protein, Total 6.1 - 7.9 g/dL 7.3 A/G Ratio 1.0 - 5.0 gm/dL 1.6 Resulting Agency [facility]  Specimen Collected: 12/30/22 09:02 Performed by: [facility] Last Resulted: 12/30/22 14:16 Received From: [facility]  Result Received: 01/05/23 16:21  ontains abnormal data Hemoglobin A1C Order: [id number] Component Ref Range & Units 6 mo ago Hemoglobin A1C 4.2 - 5.6 % 7.8 High  Average Blood Glucose (Calc) mg/dL 413 Resulting Agency [facility] Narrative Performed by [facility] Normal Range:    4.2 - 5.6% Increased Risk:  5.7 - 6.4% Diabetes:        >= 6.5% Glycemic Control for adults with diabetes:  <7%    Specimen Collected: 12/30/22 09:02 Performed by: [facility] Last Resulted: 12/30/22 14:10 Received From: [facility]  Result Received: 01/05/23 16:21 I have reviewed the labs.   Pertinent Imaging: N/A  Assessment & Plan:    1. BPH with LUTS -PSA stable - continue biannual monitoring -DRE benign  -continue conservative management, avoiding bladder irritants and timed voiding's -Continue finasteride 5 mg daily  2. Erectile dysfunction - discussed taking the sildenafil 20 mg daily and augmenting with four tablets of sildenafil for intercourse   Return in about 6 months (around 01/22/2024) for PSA only .  These notes generated with voice recognition software. I apologize for typographical errors.  Cloretta Ned  [facility] [address]  [address] [place], [place] [address] [phone]

## 2023-07-22 ENCOUNTER — Ambulatory Visit: Payer: 59 | Admitting: Urology

## 2023-07-22 ENCOUNTER — Encounter: Payer: Self-pay | Admitting: Urology

## 2023-07-22 VITALS — BP 111/80 | HR 86 | Wt 249.4 lb

## 2023-07-22 DIAGNOSIS — N529 Male erectile dysfunction, unspecified: Secondary | ICD-10-CM | POA: Diagnosis not present

## 2023-07-22 DIAGNOSIS — N401 Enlarged prostate with lower urinary tract symptoms: Secondary | ICD-10-CM | POA: Diagnosis not present

## 2023-07-22 DIAGNOSIS — N138 Other obstructive and reflux uropathy: Secondary | ICD-10-CM

## 2023-07-22 MED ORDER — SILDENAFIL CITRATE 20 MG PO TABS: ORAL_TABLET | ORAL | 3 refills | Status: AC

## 2023-08-02 ENCOUNTER — Other Ambulatory Visit: Payer: Self-pay | Admitting: Urology

## 2023-08-02 DIAGNOSIS — Z87898 Personal history of other specified conditions: Secondary | ICD-10-CM

## 2023-08-30 ENCOUNTER — Encounter: Admission: RE | Payer: Self-pay | Source: Home / Self Care

## 2023-08-30 ENCOUNTER — Ambulatory Visit: Admission: RE | Admit: 2023-08-30 | Payer: 59 | Source: Home / Self Care | Admitting: Gastroenterology

## 2023-08-30 SURGERY — COLONOSCOPY
Anesthesia: General

## 2023-08-31 ENCOUNTER — Other Ambulatory Visit: Payer: Self-pay | Admitting: Urology

## 2023-08-31 DIAGNOSIS — Z87898 Personal history of other specified conditions: Secondary | ICD-10-CM

## 2024-01-17 ENCOUNTER — Other Ambulatory Visit: Payer: Self-pay | Admitting: *Deleted

## 2024-01-17 DIAGNOSIS — N401 Enlarged prostate with lower urinary tract symptoms: Secondary | ICD-10-CM

## 2024-01-17 DIAGNOSIS — Z87898 Personal history of other specified conditions: Secondary | ICD-10-CM

## 2024-01-24 ENCOUNTER — Other Ambulatory Visit: Payer: Medicare Other

## 2024-01-24 DIAGNOSIS — N401 Enlarged prostate with lower urinary tract symptoms: Secondary | ICD-10-CM

## 2024-01-24 DIAGNOSIS — Z87898 Personal history of other specified conditions: Secondary | ICD-10-CM

## 2024-01-25 LAB — PSA: Prostate Specific Ag, Serum: 3.4 ng/mL (ref 0.0–4.0)

## 2024-08-24 ENCOUNTER — Other Ambulatory Visit: Payer: Self-pay | Admitting: Urology

## 2024-08-24 DIAGNOSIS — Z87898 Personal history of other specified conditions: Secondary | ICD-10-CM

## 2024-08-25 NOTE — Telephone Encounter (Signed)
Pt will need an appointment for further refills.  

## 2024-09-23 ENCOUNTER — Other Ambulatory Visit: Payer: Self-pay | Admitting: Urology

## 2024-09-23 DIAGNOSIS — Z87898 Personal history of other specified conditions: Secondary | ICD-10-CM
